# Patient Record
Sex: Male | Born: 1970 | Race: Black or African American | Hispanic: No | Marital: Single | Smoking: Never smoker
Health system: Southern US, Community
[De-identification: ages and names within clinical notes are randomized; demographics above are authoritative.]

## PROBLEM LIST (undated history)

## (undated) DIAGNOSIS — F329 Major depressive disorder, single episode, unspecified: Secondary | ICD-10-CM

## (undated) DIAGNOSIS — W3400XA Accidental discharge from unspecified firearms or gun, initial encounter: Secondary | ICD-10-CM

## (undated) DIAGNOSIS — F32A Depression, unspecified: Secondary | ICD-10-CM

## (undated) DIAGNOSIS — F4310 Post-traumatic stress disorder, unspecified: Secondary | ICD-10-CM

## (undated) DIAGNOSIS — B1920 Unspecified viral hepatitis C without hepatic coma: Secondary | ICD-10-CM

## (undated) DIAGNOSIS — F319 Bipolar disorder, unspecified: Secondary | ICD-10-CM

---

## 1999-04-12 ENCOUNTER — Inpatient Hospital Stay (HOSPITAL_COMMUNITY): Admission: EM | Admit: 1999-04-12 | Discharge: 1999-04-16 | Payer: Self-pay | Admitting: General Practice

## 2001-09-12 ENCOUNTER — Emergency Department (HOSPITAL_COMMUNITY): Admission: EM | Admit: 2001-09-12 | Discharge: 2001-09-12 | Payer: Self-pay | Admitting: *Deleted

## 2002-03-04 ENCOUNTER — Emergency Department (HOSPITAL_COMMUNITY): Admission: EM | Admit: 2002-03-04 | Discharge: 2002-03-04 | Payer: Self-pay | Admitting: Emergency Medicine

## 2004-02-07 ENCOUNTER — Emergency Department (HOSPITAL_COMMUNITY): Admission: EM | Admit: 2004-02-07 | Discharge: 2004-02-07 | Payer: Self-pay | Admitting: Emergency Medicine

## 2004-06-15 ENCOUNTER — Emergency Department (HOSPITAL_COMMUNITY): Admission: EM | Admit: 2004-06-15 | Discharge: 2004-06-15 | Payer: Self-pay | Admitting: Emergency Medicine

## 2004-10-06 ENCOUNTER — Emergency Department (HOSPITAL_COMMUNITY): Admission: EM | Admit: 2004-10-06 | Discharge: 2004-10-06 | Payer: Self-pay | Admitting: Emergency Medicine

## 2006-08-01 ENCOUNTER — Emergency Department (HOSPITAL_COMMUNITY): Admission: EM | Admit: 2006-08-01 | Discharge: 2006-08-01 | Payer: Self-pay | Admitting: Emergency Medicine

## 2007-06-01 ENCOUNTER — Emergency Department (HOSPITAL_COMMUNITY): Admission: EM | Admit: 2007-06-01 | Discharge: 2007-06-01 | Payer: Self-pay | Admitting: Emergency Medicine

## 2007-12-14 ENCOUNTER — Emergency Department (HOSPITAL_COMMUNITY): Admission: EM | Admit: 2007-12-14 | Discharge: 2007-12-14 | Payer: Self-pay | Admitting: Emergency Medicine

## 2008-01-08 ENCOUNTER — Emergency Department: Payer: Self-pay | Admitting: Emergency Medicine

## 2011-08-27 LAB — BASIC METABOLIC PANEL
BUN: 19
CO2: 29
Calcium: 9.3
Chloride: 105
Creatinine, Ser: 1.04
GFR calc Af Amer: >60
GFR calc non Af Amer: >60
Glucose, Bld: 109 — ABNORMAL HIGH
Potassium: 3.7
Sodium: 139

## 2011-08-27 LAB — CBC
HCT: 38.9 — ABNORMAL LOW
Hemoglobin: 13.2
MCHC: 34
MCV: 88.7
Platelets: 226
RBC: 4.39
RDW: 12.5
WBC: 3.9 — ABNORMAL LOW

## 2011-08-27 LAB — DIFFERENTIAL
Basophils Absolute: 0
Basophils Relative: 0
Eosinophils Absolute: 0.1
Eosinophils Relative: 3
Lymphocytes Relative: 38
Lymphs Abs: 1.5
Monocytes Absolute: 0.5
Monocytes Relative: 13 — ABNORMAL HIGH
Neutro Abs: 1.8
Neutrophils Relative %: 46

## 2011-08-27 LAB — RAPID URINE DRUG SCREEN, HOSP PERFORMED
Amphetamines: NOT DETECTED
Barbiturates: NOT DETECTED
Benzodiazepines: NOT DETECTED
Cocaine: POSITIVE — AB
Opiates: NOT DETECTED
Tetrahydrocannabinol: NOT DETECTED

## 2011-08-27 LAB — ETHANOL: Alcohol, Ethyl (B): <5

## 2014-02-08 ENCOUNTER — Emergency Department (HOSPITAL_COMMUNITY): Payer: Self-pay

## 2014-02-08 ENCOUNTER — Observation Stay (HOSPITAL_COMMUNITY)
Admission: EM | Admit: 2014-02-08 | Discharge: 2014-02-09 | Disposition: A | Discharge status: Home / Self Care | Payer: Self-pay | Attending: Internal Medicine | Admitting: Internal Medicine

## 2014-02-08 ENCOUNTER — Encounter (HOSPITAL_COMMUNITY): Payer: Self-pay | Admitting: Emergency Medicine

## 2014-02-08 DIAGNOSIS — R339 Retention of urine, unspecified: Secondary | ICD-10-CM | POA: Insufficient documentation

## 2014-02-08 DIAGNOSIS — F329 Major depressive disorder, single episode, unspecified: Secondary | ICD-10-CM

## 2014-02-08 DIAGNOSIS — R109 Unspecified abdominal pain: Secondary | ICD-10-CM

## 2014-02-08 DIAGNOSIS — F1410 Cocaine abuse, uncomplicated: Secondary | ICD-10-CM | POA: Insufficient documentation

## 2014-02-08 DIAGNOSIS — Z8249 Family history of ischemic heart disease and other diseases of the circulatory system: Secondary | ICD-10-CM | POA: Insufficient documentation

## 2014-02-08 DIAGNOSIS — F3289 Other specified depressive episodes: Secondary | ICD-10-CM | POA: Insufficient documentation

## 2014-02-08 DIAGNOSIS — B1920 Unspecified viral hepatitis C without hepatic coma: Secondary | ICD-10-CM | POA: Insufficient documentation

## 2014-02-08 DIAGNOSIS — R079 Chest pain, unspecified: Principal | ICD-10-CM | POA: Diagnosis present

## 2014-02-08 DIAGNOSIS — R1011 Right upper quadrant pain: Secondary | ICD-10-CM | POA: Insufficient documentation

## 2014-02-08 HISTORY — DX: Major depressive disorder, single episode, unspecified: F32.9

## 2014-02-08 HISTORY — DX: Unspecified viral hepatitis C without hepatic coma: B19.20

## 2014-02-08 HISTORY — DX: Accidental discharge from unspecified firearms or gun, initial encounter: W34.00XA

## 2014-02-08 HISTORY — DX: Depression, unspecified: F32.A

## 2014-02-08 LAB — URINALYSIS, ROUTINE W REFLEX MICROSCOPIC
Glucose, UA: NEGATIVE mg/dL
Hgb urine dipstick: NEGATIVE
Ketones, ur: >80 mg/dL — AB
Leukocytes, UA: NEGATIVE
Nitrite: NEGATIVE
PH: 6 (ref 5.0–8.0)
Protein, ur: 30 mg/dL — AB
SPECIFIC GRAVITY, URINE: 1.033 — AB (ref 1.005–1.030)
Urobilinogen, UA: 2 mg/dL — ABNORMAL HIGH (ref 0.0–1.0)

## 2014-02-08 LAB — CBC
HCT: 33.3 % — ABNORMAL LOW (ref 39.0–52.0)
HCT: 37.1 % — ABNORMAL LOW (ref 39.0–52.0)
Hemoglobin: 11.4 g/dL — ABNORMAL LOW (ref 13.0–17.0)
Hemoglobin: 12.6 g/dL — ABNORMAL LOW (ref 13.0–17.0)
MCH: 30.3 pg (ref 26.0–34.0)
MCH: 30.1 pg (ref 26.0–34.0)
MCHC: 34.2 g/dL (ref 30.0–36.0)
MCHC: 34 g/dL (ref 30.0–36.0)
MCV: 88.6 fL (ref 78.0–100.0)
MCV: 88.5 fL (ref 78.0–100.0)
PLATELETS: 188 10*3/uL (ref 150–400)
Platelets: 176 10*3/uL (ref 150–400)
RBC: 3.76 MIL/uL — ABNORMAL LOW (ref 4.22–5.81)
RBC: 4.19 MIL/uL — AB (ref 4.22–5.81)
RDW: 12.4 % (ref 11.5–15.5)
RDW: 12.4 % (ref 11.5–15.5)
WBC: 6.4 10*3/uL (ref 4.0–10.5)
WBC: 7 10*3/uL (ref 4.0–10.5)

## 2014-02-08 LAB — TROPONIN I
Troponin I: <0.3 ng/mL (ref <0.30)
Troponin I: <0.3 ng/mL (ref <0.30)
Troponin I: <0.3 ng/mL (ref <0.30)

## 2014-02-08 LAB — BASIC METABOLIC PANEL
BUN: 19 mg/dL (ref 6–23)
CO2: 27 mEq/L (ref 19–32)
Calcium: 9.2 mg/dL (ref 8.4–10.5)
Chloride: 102 mEq/L (ref 96–112)
Creatinine, Ser: 1.23 mg/dL (ref 0.50–1.35)
GFR calc Af Amer: 82 mL/min — ABNORMAL LOW (ref >90)
GFR calc non Af Amer: 70 mL/min — ABNORMAL LOW (ref >90)
Glucose, Bld: 78 mg/dL (ref 70–99)
Potassium: 4.1 mEq/L (ref 3.7–5.3)
Sodium: 143 mEq/L (ref 137–147)

## 2014-02-08 LAB — URINE MICROSCOPIC-ADD ON

## 2014-02-08 LAB — CREATININE, SERUM
CREATININE: 1.14 mg/dL (ref 0.50–1.35)
GFR calc Af Amer: 90 mL/min — ABNORMAL LOW (ref >90)
GFR calc non Af Amer: 77 mL/min — ABNORMAL LOW (ref >90)

## 2014-02-08 LAB — RAPID URINE DRUG SCREEN, HOSP PERFORMED
Amphetamines: NOT DETECTED
Barbiturates: NOT DETECTED
Benzodiazepines: NOT DETECTED
COCAINE: POSITIVE — AB
Opiates: NOT DETECTED
TETRAHYDROCANNABINOL: NOT DETECTED

## 2014-02-08 LAB — MRSA PCR SCREENING: MRSA by PCR: NEGATIVE

## 2014-02-08 LAB — PRO B NATRIURETIC PEPTIDE: Pro B Natriuretic peptide (BNP): 199.4 pg/mL — ABNORMAL HIGH (ref 0–125)

## 2014-02-08 LAB — I-STAT TROPONIN, ED: Troponin i, poc: 0 ng/mL (ref 0.00–0.08)

## 2014-02-08 LAB — RPR: RPR: REACTIVE — AB

## 2014-02-08 LAB — RPR TITER: RPR Titer: 1:2 {titer} — AB

## 2014-02-08 LAB — HIV ANTIBODY (ROUTINE TESTING W REFLEX): HIV: NONREACTIVE

## 2014-02-08 MED ORDER — FLUOXETINE HCL 20 MG PO CAPS
20.0000 mg | ORAL_CAPSULE | Freq: Every day | ORAL | Status: DC
  Administered 2014-02-08 – 2014-02-09 (×2): 20 mg via ORAL
  Filled 2014-02-08 (×2): qty 1

## 2014-02-08 MED ORDER — ONDANSETRON HCL 4 MG/2ML IJ SOLN
4.0000 mg | Freq: Once | INTRAMUSCULAR | Status: AC
  Administered 2014-02-08: 4 mg via INTRAVENOUS
  Filled 2014-02-08: qty 2

## 2014-02-08 MED ORDER — RISPERIDONE 1 MG PO TABS
1.0000 mg | ORAL_TABLET | Freq: Every day | ORAL | Status: DC
  Administered 2014-02-08: 1 mg via ORAL
  Filled 2014-02-08 (×2): qty 1

## 2014-02-08 MED ORDER — PANTOPRAZOLE SODIUM 40 MG PO TBEC
40.0000 mg | DELAYED_RELEASE_TABLET | Freq: Every day | ORAL | Status: DC
  Administered 2014-02-08 – 2014-02-09 (×2): 40 mg via ORAL
  Filled 2014-02-08 (×2): qty 1

## 2014-02-08 MED ORDER — GI COCKTAIL ~~LOC~~
30.0000 mL | Freq: Four times a day (QID) | ORAL | Status: DC | PRN
  Administered 2014-02-08: 30 mL via ORAL
  Filled 2014-02-08: qty 30

## 2014-02-08 MED ORDER — MORPHINE SULFATE 4 MG/ML IJ SOLN
4.0000 mg | Freq: Once | INTRAMUSCULAR | Status: AC
  Administered 2014-02-08: 4 mg via INTRAVENOUS
  Filled 2014-02-08: qty 1

## 2014-02-08 MED ORDER — MORPHINE SULFATE 2 MG/ML IJ SOLN
2.0000 mg | INTRAMUSCULAR | Status: DC | PRN
  Administered 2014-02-08: 2 mg via INTRAVENOUS
  Filled 2014-02-08: qty 1

## 2014-02-08 MED ORDER — TAMSULOSIN HCL 0.4 MG PO CAPS
0.4000 mg | ORAL_CAPSULE | Freq: Every day | ORAL | Status: DC
  Administered 2014-02-09: 0.4 mg via ORAL
  Filled 2014-02-08 (×2): qty 1

## 2014-02-08 MED ORDER — HEPARIN SODIUM (PORCINE) 5000 UNIT/ML IJ SOLN
5000.0000 [IU] | Freq: Three times a day (TID) | INTRAMUSCULAR | Status: DC
  Administered 2014-02-08 – 2014-02-09 (×2): 5000 [IU] via SUBCUTANEOUS
  Filled 2014-02-08 (×6): qty 1

## 2014-02-08 MED ORDER — DIVALPROEX SODIUM ER 500 MG PO TB24
1000.0000 mg | ORAL_TABLET | Freq: Every day | ORAL | Status: DC
  Administered 2014-02-08: 1000 mg via ORAL
  Filled 2014-02-08 (×2): qty 2
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
## 2014-02-08 NOTE — H&P (Signed)
Internal Medicine Attending Admission Note Date: 02/08/2014  Patient name: Charles Douglas Medical record number: [id number] Date of birth: [birth year] Age: 43 y.o. Gender: male  I saw and evaluated the patient. I reviewed the resident's note and I agree with the resident's findings and plan as documented in the resident's note, with the following additional comments.  Chief Complaint(s): Chest pain, dizziness, nausea/vomiting  History - key components related to admission: Patient is a 43 year old man with history of hepatitis C, depression, and cocaine abuse admitted with complaint of chest pain, dizziness, and episodes of nausea/vomiting.  Patient reports that he relapsed yesterday and smoked crack cocaine; last night he became dizzy and had a few episodes of nausea/vomiting.  Today he had the onset of chest pain while walking, still with associated dizziness and nausea.  He denies any history of known cardiac disease; he does report that years ago he was treated for high blood pressure and high cholesterol.  He reports a family history of early coronary disease in his father.  He reports that he has been through inpatient drug rehabilitation once in the past, and is currently enrolled in an outpatient program.  Physical Exam - key components related to admission:  Filed Vitals:   02/08/14 1215 02/08/14 1230 02/08/14 1245 02/08/14 1300  BP: 125/73 140/67 131/69   Pulse: 71 87 74   Temp:      TempSrc:      Resp: 19 16 22    Height:    5\' 7"  (1.702 m)  Weight:    141 lb 1.5 oz (64 kg)  SpO2: 99% 99% 98%    General: Alert, no distress Lungs: Clear Heart: Regular; S1-S2, no S3, no S4, no murmurs Abdomen: Bowel sounds present, soft, nontender Shoney's: No edema; no calf tenderness  Lab results:   Basic Metabolic Panel:  Recent Labs  13/24/4002/03/22 0952  NA 143  K 4.1  CL 102  CO2 27  GLUCOSE 78  BUN 19  CREATININE 1.23  CALCIUM 9.2     CBC:  Recent Labs  02/08/14 0952   WBC 6.4  HGB 11.4*  HCT 33.3*  MCV 88.6  PLT 176    BNP:  Recent Labs  02/08/14 0952  PROBNP 199.4*    Troponin i, poc  Date Value Ref Range Status  02/08/2014 0.00  0.00 - 0.08 ng/mL Final     Urine Drug Screen: Drugs of Abuse     Component Value Date/Time   LABOPIA NONE DETECTED 02/08/2014 1258   COCAINSCRNUR POSITIVE* 02/08/2014 1258   LABBENZ NONE DETECTED 02/08/2014 1258   AMPHETMU NONE DETECTED 02/08/2014 1258   THCU NONE DETECTED 02/08/2014 1258   LABBARB NONE DETECTED 02/08/2014 1258     Alcohol Level: No results found for this basename: ETH,  in the last 72 hours  Urinalysis    Component Value Date/Time   COLORURINE AMBER* 02/08/2014 1258   APPEARANCEUR CLEAR 02/08/2014 1258   LABSPEC 1.033* 02/08/2014 1258   PHURINE 6.0 02/08/2014 1258   GLUCOSEU NEGATIVE 02/08/2014 1258   HGBUR NEGATIVE 02/08/2014 1258   BILIRUBINUR SMALL* 02/08/2014 1258   KETONESUR >80* 02/08/2014 1258   PROTEINUR 30* 02/08/2014 1258   UROBILINOGEN 2.0* 02/08/2014 1258   NITRITE NEGATIVE 02/08/2014 1258   LEUKOCYTESUR NEGATIVE 02/08/2014 1258    Urine microscopic:  Recent Labs  02/08/14 1258  EPIU FEW*  WBCU 0-2  BACTERIA RARE  LABCAST HYALINE CASTS*  OTHERU MUCOUS PRESENT      Imaging results:  Dg Chest  2 View  02/08/2014   CLINICAL DATA:  Chest pain  EXAM: CHEST  2 VIEW  COMPARISON:  October 06, 2004  FINDINGS: Lungs are clear. Heart size and pulmonary vascularity are normal. No adenopathy. No pneumothorax. No bone lesions.  IMPRESSION: No abnormality noted.   Electronically Signed   By: Bretta Bang M.D.   On: 02/08/2014 10:23    Other results: EKG: Normal sinus rhythm; minimal voltage criteria for LVH, may be normal variant  Assessment & Plan by Problem:  1.  Chest pain.  Patient's chest pain and associated symptoms were likely provoked by crack cocaine use.  He has no evidence of acute MI by EKG or by the initial cardiac enzyme result.  The plan is monitor; rule out MI with serial enzymes  and EKGs; morphine as needed for pain relief; if pain persists, consider cardiology consult for noninvasive workup to rule out underlying coronary artery disease.  Abstinence from cocaine is a priority, and we have explained this to the patient.  2.  Cocaine abuse.  Patient reports that he has undergone inpatient treatment in the past, and that he is currently enrolled in an outpatient treatment program.  The plan is encouraged compliance with his treatment program and abstinence from cocaine.  3.  Nausea/vomiting.  As above, this is likely related to cocaine use.  Plan is supportive care; PPI; consider further workup if symptoms persist.  4.  Other problems and plans as per the resident physician's note.  Active Problems:   Chest pain

## 2014-02-08 NOTE — ED Notes (Signed)
Pt return back from x ray

## 2014-02-08 NOTE — ED Provider Notes (Signed)
Medical screening examination/treatment/procedure(s) were conducted as a shared visit with non-physician practitioner(s) and myself.  I personally evaluated the patient during the encounter.   EKG Interpretation None      Date: 02/08/2014  Rate: 72  Rhythm: normal sinus rhythm  QRS Axis: normal  Intervals: normal  ST/T Wave abnormalities: normal  Conduction Disutrbances:none  Narrative Interpretation:   Old EKG Reviewed: none available  Patient here after having exertional chest pain made better with nitroglycerin. Does admit to cocaine use but denied having chest pain 2 hours after he uses cocaine. Has additional family history of CAD.     Toy BakerAnthony T Milina Pagett, MD 02/12/14 [phone]

## 2014-02-08 NOTE — ED Notes (Signed)
Pt called [facility] while walking from [place] after having chest pain.  Pt reports doing about $40-50 worth of cocaine last night and nausea and vomiting this am prior to the chest pain starting.  Pt given 324 mg apsirin and 0.4 mg nitroglycerin x2 pain decreased from 6/10 to 2/10.  Pt in NAD, A&O.
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
## 2014-02-09 LAB — COMPREHENSIVE METABOLIC PANEL
ALT: 24 U/L (ref 0–53)
AST: 30 U/L (ref 0–37)
Albumin: 3.3 g/dL — ABNORMAL LOW (ref 3.5–5.2)
Alkaline Phosphatase: 51 U/L (ref 39–117)
BILIRUBIN TOTAL: 0.4 mg/dL (ref 0.3–1.2)
BUN: 15 mg/dL (ref 6–23)
CHLORIDE: 101 meq/L (ref 96–112)
CO2: 28 mEq/L (ref 19–32)
CREATININE: 1.14 mg/dL (ref 0.50–1.35)
Calcium: 8.9 mg/dL (ref 8.4–10.5)
GFR, EST AFRICAN AMERICAN: 90 mL/min — AB (ref >90)
GFR, EST NON AFRICAN AMERICAN: 77 mL/min — AB (ref >90)
GLUCOSE: 99 mg/dL (ref 70–99)
Potassium: 4 mEq/L (ref 3.7–5.3)
Sodium: 141 mEq/L (ref 137–147)
Total Protein: 6.6 g/dL (ref 6.0–8.3)

## 2014-02-09 LAB — HEMOGLOBIN A1C
HEMOGLOBIN A1C: 4.9 % (ref <5.7)
Mean Plasma Glucose: 94 mg/dL (ref <117)

## 2014-02-09 LAB — TROPONIN I: Troponin I: <0.3 ng/mL (ref <0.30)

## 2014-02-09 LAB — GC/CHLAMYDIA PROBE AMP
CT Probe RNA: NEGATIVE
GC PROBE AMP APTIMA: NEGATIVE

## 2014-02-09 LAB — TSH: TSH: 1.487 u[IU]/mL (ref 0.350–4.500)

## 2014-02-09 LAB — T.PALLIDUM AB, IGG: T pallidum Antibodies (TP-PA): >8 S/CO — ABNORMAL HIGH (ref <0.90)

## 2014-02-09 MED ORDER — TAMSULOSIN HCL 0.4 MG PO CAPS: 0.4000 mg | ORAL_CAPSULE | Freq: Every day | ORAL | Status: DC

## 2014-02-09 NOTE — Progress Notes (Signed)
Discharged  home  by wheelchair, stable, belongings with pt.

## 2014-02-09 NOTE — H&P (Signed)
Resident Addendum to Medical Student Note   I have seen and examined the patient, and agree with the the medical student assessment and plan outlined above. Please see my note for additional details.  Charles Douglas, Charles Douglas  02/09/2014, 6:59 AM Pgr: [phone]

## 2014-02-09 NOTE — Discharge Instructions (Signed)
[facility] for evaluation of chest pain that occurred after using cocaine.  Cocaine use puts you at high risk of a serious cause of chest pain, including heart attack.  However, your tests and studies were normal, making it more likely that you chest pain is due to a muscle strain.  This can be treated with rest, heat or ice, and with ibuprofen (over the counter) if needed - no more than 400mg  every 8 hours.  It is very important to stop using cocaine to help prevent serious consequences such as heart attack, stroke, or kidney failure.  Please return to the Emergency Room if you have worsening chest pain, shortness of breath, sudden weakness or changes in vision, or other new symptoms.  Chest Pain (Nonspecific) It is often hard to give a specific diagnosis for the cause of chest pain. There is always a chance that your pain could be related to something serious, such as a heart attack or a blood clot in the lungs. You need to follow up with your caregiver for further evaluation. CAUSES   Heartburn.  Pneumonia or bronchitis.  Anxiety or stress.  Inflammation around your heart (pericarditis) or lung (pleuritis or pleurisy).  A blood clot in the lung.  A collapsed lung (pneumothorax). It can develop suddenly on its own (spontaneous pneumothorax) or from injury (trauma) to the chest.  Shingles infection (herpes zoster virus). The chest wall is composed of bones, muscles, and cartilage. Any of these can be the source of the pain.  The bones can be bruised by injury.  The muscles or cartilage can be strained by coughing or overwork.  The cartilage can be affected by inflammation and become sore (costochondritis). DIAGNOSIS  Lab tests or other studies, such as X-rays, electrocardiography, stress testing, or cardiac imaging, may be needed to find the cause of your pain.  TREATMENT   Treatment depends on what may be causing your chest pain. Treatment may  include:  Acid blockers for heartburn.  Anti-inflammatory medicine.  Pain medicine for inflammatory conditions.  Antibiotics if an infection is present.  You may be advised to change lifestyle habits. This includes stopping smoking and avoiding alcohol, caffeine, and chocolate.  You may be advised to keep your head raised (elevated) when sleeping. This reduces the chance of acid going backward from your stomach into your esophagus.  Most of the time, nonspecific chest pain will improve within 2 to 3 days with rest and mild pain medicine. HOME CARE INSTRUCTIONS   If antibiotics were prescribed, take your antibiotics as directed. Finish them even if you start to feel better.  For the next few days, avoid physical activities that bring on chest pain. Continue physical activities as directed.  Do not smoke.  Avoid drinking alcohol.  Only take over-the-counter or prescription medicine for pain, discomfort, or fever as directed by your caregiver.  Follow your caregiver's suggestions for further testing if your chest pain does not go away.  Keep any follow-up appointments you made. If you do not go to an appointment, you could develop lasting (chronic) problems with pain. If there is any problem keeping an appointment, you must call to reschedule. SEEK MEDICAL CARE IF:   You think you are having problems from the medicine you are taking. Read your medicine instructions carefully.  Your chest pain does not go away, even after treatment.  You develop a rash with blisters on your chest. SEEK IMMEDIATE MEDICAL CARE IF:   You have  increased chest pain or pain that spreads to your arm, neck, jaw, back, or abdomen.  You develop shortness of breath, an increasing cough, or you are coughing up blood.  You have severe back or abdominal pain, feel nauseous, or vomit.  You develop severe weakness, fainting, or chills.  You have a fever. THIS IS AN EMERGENCY. Do not wait to see if the  pain will go away. Get medical help at once. Call your local emergency services (911 in [place] Do not drive yourself to the hospital. MAKE SURE YOU:   Understand these instructions.  Will watch your condition.  Will get help right away if you are not doing well or get worse. Document Released: 09/03/2005 Document Revised: 02/16/2012 Document Reviewed: 06/29/2008 [facility] Patient Information 2014 [place], [place]. Chest Wall Pain Chest wall pain is pain in or around the bones and muscles of your chest. It may take up to 6 weeks to get better. It may take longer if you must stay physically active in your work and activities.  CAUSES  Chest wall pain may happen on its own. However, it may be caused by:  A viral illness like the flu.  Injury.  Coughing.  Exercise.  Arthritis.  Fibromyalgia.  Shingles. HOME CARE INSTRUCTIONS   Avoid overtiring physical activity. Try not to strain or perform activities that cause pain. This includes any activities using your chest or your abdominal and side muscles, especially if heavy weights are used.  Put ice on the sore area.  Put ice in a plastic bag.  Place a towel between your skin and the bag.  Leave the ice on for 15-20 minutes per hour while awake for the first 2 days.  Only take over-the-counter or prescription medicines for pain, discomfort, or fever as directed by your caregiver. SEEK IMMEDIATE MEDICAL CARE IF:   Your pain increases, or you are very uncomfortable.  You have a fever.  Your chest pain becomes worse.  You have new, unexplained symptoms.  You have nausea or vomiting.  You feel sweaty or lightheaded.  You have a cough with phlegm (sputum), or you cough up blood. MAKE SURE YOU:   Understand these instructions.  Will watch your condition.  Will get help right away if you are not doing well or get worse. Document Released: 11/24/2005 Document Revised: 02/16/2012 Document Reviewed: 07/21/2011 [facility]  Patient Information 2014 [place], [place].

## 2014-02-09 NOTE — Progress Notes (Signed)
Subjective: Nursing staff reports some behavioral issues with patient exposing himself to male staff.  Patient reports continued mild right sided chest pain, 2-3/10, and continued mild abdominal pain, which has been chronic for him.  He reports that his urinary voiding improved on tamsulosin.  Objective: Vital signs in last 24 hours: Filed Vitals:   02/08/14 2100 02/08/14 2353 02/09/14 0459 02/09/14 0746  BP: 129/74 111/56 104/56 102/63  Pulse: 70 59 51 52  Temp:  97.7 F (36.5 C) 97.2 F (36.2 C) 97.7 F (36.5 C)  TempSrc:  Oral Oral Oral  Resp:      Height:      Weight:      SpO2: 97% 97% 96% 98%   Weight change:   Intake/Output Summary (Last 24 hours) at 02/09/14 0849 Last data filed at 02/09/14 0300  Gross per 24 hour  Intake    480 ml  Output   1525 ml  Net  -1045 ml   Physical Exam General: Resting comfortably in no acute distress.  Eyes: EOMI. Conjunctivae normal, sclerae clear and anicteric.  HENT: Oropharyngeal mucous membranes pink and moist.  Pulm: Normal respiratory rate and effort. CTAB. Cardio: RRR, S1 and S2 with no murmur, rub, or gallop appreciated.  Abdomen: Normoactive bowel sounds. Soft, non-distended. + mild tenderness to palpation especially over the right mid-upper quadrant.  Extremities: Warm and well perfused, with DP pulses 2+ bilaterally. No edema or cyanosis. +Multiple circular superficial lesions over lower extremities in various stages of healing.  Neuro: Alert, oriented, and appropriate. CN II-XII grossly intact.   Lab Results:  Results for orders placed during the hospital encounter of 02/08/14 (from the past 24 hour(s))  CBC     Status: Abnormal   Collection Time    02/08/14  9:52 AM      Result Value Ref Range   WBC 6.4  4.0 - 10.5 K/uL   RBC 3.76 (*) 4.22 - 5.81 MIL/uL   Hemoglobin 11.4 (*) 13.0 - 17.0 g/dL   HCT 91.4 (*) 78.2 - 95.6 %   MCV 88.6  78.0 - 100.0 fL   MCH 30.3  26.0 - 34.0 pg   MCHC 34.2  30.0 - 36.0 g/dL   RDW  21.3  08.6 - 57.8 %   Platelets 176  150 - 400 K/uL  BASIC METABOLIC PANEL     Status: Abnormal   Collection Time    02/08/14  9:52 AM      Result Value Ref Range   Sodium 143  137 - 147 mEq/L   Potassium 4.1  3.7 - 5.3 mEq/L   Chloride 102  96 - 112 mEq/L   CO2 27  19 - 32 mEq/L   Glucose, Bld 78  70 - 99 mg/dL   BUN 19  6 - 23 mg/dL   Creatinine, Ser 4.69  0.50 - 1.35 mg/dL   Calcium 9.2  8.4 - 62.9 mg/dL   GFR calc non Af Amer 70 (*) >90 mL/min   GFR calc Af Amer 82 (*) >90 mL/min  PRO B NATRIURETIC PEPTIDE     Status: Abnormal   Collection Time    02/08/14  9:52 AM      Result Value Ref Range   Pro B Natriuretic peptide (BNP) 199.4 (*) 0 - 125 pg/mL  I-STAT TROPOININ, ED     Status: None   Collection Time    02/08/14 10:27 AM      Result Value Ref Range   Troponin i,  poc 0.00  0.00 - 0.08 ng/mL   Comment 3           URINE RAPID DRUG SCREEN (HOSP PERFORMED)     Status: Abnormal   Collection Time    02/08/14 12:58 PM      Result Value Ref Range   Opiates NONE DETECTED  NONE DETECTED   Cocaine POSITIVE (*) NONE DETECTED   Benzodiazepines NONE DETECTED  NONE DETECTED   Amphetamines NONE DETECTED  NONE DETECTED   Tetrahydrocannabinol NONE DETECTED  NONE DETECTED   Barbiturates NONE DETECTED  NONE DETECTED  URINALYSIS, ROUTINE W REFLEX MICROSCOPIC     Status: Abnormal   Collection Time    02/08/14 12:58 PM      Result Value Ref Range   Color, Urine AMBER (*) YELLOW   APPearance CLEAR  CLEAR   Specific Gravity, Urine 1.033 (*) 1.005 - 1.030   pH 6.0  5.0 - 8.0   Glucose, UA NEGATIVE  NEGATIVE mg/dL   Hgb urine dipstick NEGATIVE  NEGATIVE   Bilirubin Urine SMALL (*) NEGATIVE   Ketones, ur >80 (*) NEGATIVE mg/dL   Protein, ur 30 (*) NEGATIVE mg/dL   Urobilinogen, UA 2.0 (*) 0.0 - 1.0 mg/dL   Nitrite NEGATIVE  NEGATIVE   Leukocytes, UA NEGATIVE  NEGATIVE  URINE MICROSCOPIC-ADD ON     Status: Abnormal   Collection Time    02/08/14 12:58 PM      Result Value Ref Range     Squamous Epithelial / LPF FEW (*) RARE   WBC, UA 0-2  <3 WBC/hpf   Bacteria, UA RARE  RARE   Casts HYALINE CASTS (*) NEGATIVE   Urine-Other MUCOUS PRESENT    MRSA PCR SCREENING     Status: None   Collection Time    02/08/14  1:12 PM      Result Value Ref Range   MRSA by PCR NEGATIVE  NEGATIVE  HIV ANTIBODY (ROUTINE TESTING)     Status: None   Collection Time    02/08/14  2:13 PM      Result Value Ref Range   HIV NON REACTIVE  NON REACTIVE  RPR     Status: Abnormal   Collection Time    02/08/14  2:13 PM      Result Value Ref Range   RPR Reactive (*) NON REACTIVE  TROPONIN I     Status: None   Collection Time    02/08/14  2:13 PM      Result Value Ref Range   Troponin I <0.30  <0.30 ng/mL  RPR TITER     Status: Abnormal   Collection Time    02/08/14  2:13 PM      Result Value Ref Range   RPR Titer 1:2 (*) NON REACTIVE  CBC     Status: Abnormal   Collection Time    02/08/14  3:50 PM      Result Value Ref Range   WBC 7.0  4.0 - 10.5 K/uL   RBC 4.19 (*) 4.22 - 5.81 MIL/uL   Hemoglobin 12.6 (*) 13.0 - 17.0 g/dL   HCT 16.1 (*) 09.6 - 04.5 %   MCV 88.5  78.0 - 100.0 fL   MCH 30.1  26.0 - 34.0 pg   MCHC 34.0  30.0 - 36.0 g/dL   RDW 40.9  81.1 - 91.4 %   Platelets 188  150 - 400 K/uL  CREATININE, SERUM     Status: Abnormal   Collection Time  02/08/14  3:50 PM      Result Value Ref Range   Creatinine, Ser 1.14  0.50 - 1.35 mg/dL   GFR calc non Af Amer 77 (*) >90 mL/min   GFR calc Af Amer 90 (*) >90 mL/min  TROPONIN I     Status: None   Collection Time    02/08/14  3:51 PM      Result Value Ref Range   Troponin I <0.30  <0.30 ng/mL  TROPONIN I     Status: None   Collection Time    02/08/14  6:48 PM      Result Value Ref Range   Troponin I <0.30  <0.30 ng/mL  TROPONIN I     Status: None   Collection Time    02/09/14  1:04 AM      Result Value Ref Range   Troponin I <0.30  <0.30 ng/mL  COMPREHENSIVE METABOLIC PANEL     Status: Abnormal   Collection Time     02/09/14  2:53 AM      Result Value Ref Range   Sodium 141  137 - 147 mEq/L   Potassium 4.0  3.7 - 5.3 mEq/L   Chloride 101  96 - 112 mEq/L   CO2 28  19 - 32 mEq/L   Glucose, Bld 99  70 - 99 mg/dL   BUN 15  6 - 23 mg/dL   Creatinine, Ser 1.611.14  0.50 - 1.35 mg/dL   Calcium 8.9  8.4 - 09.610.5 mg/dL   Total Protein 6.6  6.0 - 8.3 g/dL   Albumin 3.3 (*) 3.5 - 5.2 g/dL   AST 30  0 - 37 U/L   ALT 24  0 - 53 U/L   Alkaline Phosphatase 51  39 - 117 U/L   Total Bilirubin 0.4  0.3 - 1.2 mg/dL   GFR calc non Af Amer 77 (*) >90 mL/min   GFR calc Af Amer 90 (*) >90 mL/min   Studies/Results: Dg Chest 2 View  02/08/2014   CLINICAL DATA:  Chest pain  EXAM: CHEST  2 VIEW  COMPARISON:  October 06, 2004  FINDINGS: Lungs are clear. Heart size and pulmonary vascularity are normal. No adenopathy. No pneumothorax. No bone lesions.  IMPRESSION: No abnormality noted.   Electronically Signed   By: Bretta BangWilliam  Woodruff M.D.   On: 02/08/2014 10:23   <ECG>  ECG this AM with sinus bradycardia, no evidence of ischemia or infarction, unchanged from yesterday.  Medications: I have reviewed the patient's current medications. Scheduled Meds: . divalproex  1,000 mg Oral QHS  . FLUoxetine  20 mg Oral Daily  . heparin  5,000 Units Subcutaneous 3 times per day  . pantoprazole  40 mg Oral Daily  . risperiDONE  1 mg Oral QHS  . tamsulosin  0.4 mg Oral QPC breakfast   Continuous Infusions:  PRN Meds:.gi cocktail, morphine injection Assessment/Plan: Active Problems:   Chest pain  Chest pain  Mr. Charles Douglas is a 43 year old man with a history of severe depression and hepatitis C who presented with chest pain onset 11 hours after crack cocaine use.   # Chest pain: Associated lightheadedness and shortness of breath and the setting of cocaine use were concerning for a possible ischemic origin, and BNP was slightly elevated at 199; however, serial troponins were negative, repeat ECGs were normal, telemetry recorded no acute  events, and TIMI score is 0-1.  Pain is right sided and highly reproducible.  -counseled patient re:  abstinence from cocaine  -patient seen by Child psychotherapist in ED -fasting lipid panel cancelled; follow up on an outpatient basis -A1c pending -TSH pending -discharge today after ruling out ACS  # Abdominal pain: RUQ pain in a patient with known Hepatitis C suggestive of inflammation of the liver capsule; pain is mild, chronic, and unchanged recently.  LFTs normal. -no further inpatient workup required  # Depression: Patient without suicidal ideation, sees a psychiatrist 'Amanda' in [place] but states he cannot remember her last name; patient feels his psychiatric care is going well -continue home medications: divalproex, risperidone, and fluoxetine   # Incomplete voiding: Symptoms consistent with BPH, although patient at risk for STIs given multiple sexual partners and history of syphilis.  Patient reports improvement after starting tamsulosin  -continue tamsulosin -HIV negative -RPR titer 1:2, c/w treated syphilis in 2010 -CG/Chlamydia pending   This is a Psychologist, occupational Note.  The care of the patient was discussed with Dr. Burtis Junes and the assessment and plan formulated with their assistance.  Please see their attached note for official documentation of the daily encounter.   LOS: 1 day   Jorge Mandril, Med Student 02/09/2014, 8:49 AM    I have seen the patient and reviewed the daily progress note by Glendon Axe MS 4 and discussed the care of the patient with them.  See below for documentation of my findings, assessment, and plans.  Subjective: Pt had rude and inappropriate behavior with male staff. No acute events overnight. VSS. Pt not having any pain this AM.  Tele reviewed: NSR.   Objective: Vital signs in last 24 hours: Filed Vitals:   02/08/14 2100 02/08/14 2353 02/09/14 0459 02/09/14 0746  BP: 129/74 111/56 104/56 102/63  Pulse: 70 59 51 52  Temp:  97.7 F  (36.5 C) 97.2 F (36.2 C) 97.7 F (36.5 C)  TempSrc:  Oral Oral Oral  Resp:      Height:      Weight:      SpO2: 97% 97% 96% 98%   Weight change:   Intake/Output Summary (Last 24 hours) at 02/09/14 1127 Last data filed at 02/09/14 0900  Gross per 24 hour  Intake    780 ml  Output   1525 ml  Net   -745 ml   General: resting in bed HEENT: PERRL, EOMI, no scleral icterus Cardiac: RRR, no rubs, murmurs or gallops Pulm: clear to auscultation bilaterally, moving normal volumes of air Abd: soft, nontender, nondistended, BS present Ext: warm and well perfused, no pedal edema Neuro: alert and oriented X3, cranial nerves II-XII grossly intact  Medications: I have reviewed the patient's current medications. Scheduled Meds: . divalproex  1,000 mg Oral QHS  . FLUoxetine  20 mg Oral Daily  . heparin  5,000 Units Subcutaneous 3 times per day  . pantoprazole  40 mg Oral Daily  . risperiDONE  1 mg Oral QHS  . tamsulosin  0.4 mg Oral QPC breakfast   Continuous Infusions:  PRN Meds:.gi cocktail, morphine injection Assessment/Plan: # Chest pain: Pt has atypical presentation, recent cocaine use (repeat UDS +cocaine) but in the setting of extensive family hx of early cardiac disease cardiac etiology must be ruled out. Pt apparently does have some primary care services abnd denied any known hx of HTN or HLD. Pt TIMI is 1. Initial troponin was negative with no new EKG changes. Pt with some generalized GI complaint indicating possible gastritis. Pt is ttp over right pectoralis muscle making MSK possible etiology. Pt  has no hypoxia/tachycardia and Geneva score 0 making PE lower on differential. No signs of pancreatitis, dissection, or esophageal rupture. CXR no signs of PNA or pneumothorax. Trops negx3, repeat EKG w/o ischemic changes.  -rest, NSAIDs, and hot/cold packs for MSK pain -consider oupt stress test at PCP followup -FLD, HgbA1c, TSH pending at discharge  #Urinary retention: Pts hx seems  consistent with BPH. UA negative for active infection. HIV negative. GC/chlamdia negative, RPR typical for previously treated infected.  -monitor I&O  -flomax   #Severe Depression: pt is not actively suicidal or homicidal at this time. Will continue home meds.  -cont Risperdal, Prozac, depakote  Dispo: Disposition is deferred at this time, awaiting improvement of current medical problems.  Anticipated discharge in approximately 1 day(s).   The patient does have a current PCP (No primary provider on file.) and does need an [facility] hospital follow-up appointment after discharge.  The patient does not have transportation limitations that hinder transportation to clinic appointments.  .Services Needed at time of discharge: Y = Yes, Blank = No PT:   OT:   RN:   Equipment:   Other:     LOS: 1 day   Christen Bame, MD 02/09/2014, 11:27 AM Pgr: [phone]

## 2014-02-09 NOTE — Discharge Summary (Signed)
Name: Charles Douglas MRN: [id number] DOB: 04/06/1971 43 y.o. PCP: No primary provider on file.  Date of Admission: 02/08/2014  9:21 AM Date of Discharge: 02/11/2014 Attending Physician: Dr. Meredith Douglas  Discharge Diagnosis: Active Problems:   Chest pain  Discharge Medications:   Medication List         divalproex 500 MG 24 hr tablet  Commonly known as:  DEPAKOTE ER  Take 1,000 mg by mouth at bedtime.     FLUoxetine 20 MG capsule  Commonly known as:  PROZAC  Take 20 mg by mouth daily.     risperiDONE 1 MG tablet  Commonly known as:  RISPERDAL  Take 1 mg by mouth at bedtime.     tamsulosin 0.4 MG Caps capsule  Commonly known as:  FLOMAX  Take 1 capsule (0.4 mg total) by mouth daily after breakfast.     traMADol 50 MG tablet  Commonly known as:  ULTRAM  Take 50 mg by mouth every 8 (eight) hours as needed for moderate pain.     traZODone 100 MG tablet  Commonly known as:  DESYREL  Take 50-100 mg by mouth at bedtime.       Disposition and follow-up:   Charles Douglas [facility] in Stable condition.  At the hospital follow up visit please address:  1.  Possible need for cardiac stress testing  2.  Labs / imaging needed at time of follow-up: none  3.  Pending labs/ test needing follow-up: none  Follow-up Appointments: Follow-up Information   Follow up with [facility] center On 02/20/2014. (Your appointment 3:30 pm)       Discharge Instructions: Discharge Orders   Future Orders Complete By Expires   Diet - low sodium heart healthy  As directed    Increase activity slowly  As directed       Consultations:    Procedures Performed:  Dg Chest 2 View  02/08/2014   CLINICAL DATA:  Chest pain  EXAM: CHEST  2 VIEW  COMPARISON:  October 06, 2004  FINDINGS: Lungs are clear. Heart size and pulmonary vascularity are normal. No adenopathy. No pneumothorax. No bone lesions.  IMPRESSION: No abnormality noted.   Electronically  Signed   By: Charles Douglas M.D.   On: 02/08/2014 10:23   Admission HPI: Charles Douglas is a 43 yo man pmh of severe depression, hepC, and cocaine abuse who presented with ongoing chest pain. The patient states that yesterday he "relapsed" using about $40-$50 worth of crack (smoked). This chest pain was 5/10 in quality sharp in nature centered around his right chest radiating to right shoulder that started suddenly when he was walking from [place] to [place]. This pain was accompanied by a period of vomiting and some nausea. He denied any SOB, DOE, HA, blurry vision, numbness/tingling, fever/chills, diarrhea, constipation, or syncope. He denied any other drug use including EtOH, smoking, or IVDU hx. Before this chest pain incident he was feeling overall well but has had increased anorexia (with estimated 20lb weight loss in 1-2 months) and nausea with some RUQ pain. Over the past month the patient states he has had trouble urinating. He has had very poor stream, dribbles his urine, feels he doesn't fully empty his bladder. He has had several unprotected sexual encounters and a history of treated syphilis. He denied any penile drainage, scrotal/testicular pain, dysuria, pyuria, or hematuria.  His main complaint is "flares" of his depression that caused him to smoke cocaine.  He is not actively suicidal but has had periods of fleeting/passive suicidal thoughts over the past months. He is undergoing treatment and CBT therapy with a counselor. 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   Hospital Course by problem list: # Chest pain: Pt had atypical presentation, recent cocaine use (repeat UDS +cocaine) but in the setting of extensive family hx of early cardiac disease. Pt apparently does have some primary care services and denied any known hx of HTN or HLD. Pt TIMI was 1. Pt was ttp over right pectoralis muscle making MSK  possible etiology. Pt has no hypoxia/tachycardia and Geneva score 0 making PE lower on differential. No signs of pancreatitis, dissection, or esophageal rupture. CXR no signs of PNA or pneumothorax. Serial troponins were negative, ECGs were unchanged and showed no evidence of ischemia or infarct ruling out ACS. Pt may benefit from outpt stress testing. Risk stratification labs were drawn, with A1c 4.9 and TSH wnl. During his hospitalization, nursing staff reported behavioral issues with the male staff, which were resolved by having only male staff care for patient.  #Abdominal pain: pt has hx of HepC with some hepatomegaly on exam. ddx included chronic hepc infection, gastritis, or GERD. Pt had negative murphy's on exam and no lab abnormalities to suggest pancreatitis/gallbladder pathology.consider hepc viral load and [facility].   #Urinary retention: Pts hx seems consistent with BPH. UA negative for active infection. STD panel (includign HIV, GC/chlamydia) negative. Pt had great improvement with start of Flomax. This was continued at discharge.   #Severe Depression: pt was not actively suicidal or homicidal during admission. Therefore pt was continued on home meds of Risperdal, Prozac, and depakote.  Discharge Vitals:   BP 102/63  Pulse 52  Temp(Src) 97.7 F (36.5 C) (Oral)  Resp 17  Ht 5\' 7"  (1.702 m)  Wt 141 lb 1.5 oz (64 kg)  BMI 22.09 kg/m2  SpO2 98%  General: Resting comfortably in no acute distress.  Eyes: EOMI. Conjunctivae normal, sclerae clear and anicteric.  HENT: Oropharyngeal mucous membranes pink and moist.  Pulm: Normal respiratory rate and effort. CTAB.  Cardio: RRR, S1 and S2 with no murmur, rub, or gallop appreciated.  Abdomen: Normoactive bowel sounds. Soft, non-distended. + mild tenderness to palpation especially over the right mid-upper quadrant.  Extremities: Warm and well perfused, with DP pulses 2+ bilaterally. No edema or cyanosis.  +Multiple circular superficial lesions over lower extremities in various stages of healing.  Neuro: Alert, oriented, and appropriate. CN II-XII grossly intact.   Signed: Christen BameNora Brieanne Mignone, MD 02/11/2014, 12:13 PM   Time Spent on Discharge: 35 minutes Services Ordered on Discharge: none Equipment Ordered on Discharge: none

## 2014-02-11 NOTE — ED Provider Notes (Signed)
Medical screening examination/treatment/procedure(s) were conducted as a shared visit with non-physician practitioner(s) and myself.  I personally evaluated the patient during the encounter.   EKG Interpretation None       Toy BakerAnthony T Caressa Scearce, MD 02/11/14 [id number]

## 2015-09-02 ENCOUNTER — Encounter (HOSPITAL_COMMUNITY): Payer: Self-pay | Admitting: Emergency Medicine

## 2015-09-02 ENCOUNTER — Emergency Department (HOSPITAL_COMMUNITY)
Admission: EM | Admit: 2015-09-02 | Discharge: 2015-09-02 | Disposition: A | Discharge status: Other Acute Inpatient Hospital | Payer: Self-pay | Attending: Emergency Medicine | Admitting: Emergency Medicine

## 2015-09-02 ENCOUNTER — Observation Stay (HOSPITAL_COMMUNITY)
Admission: AD | Admit: 2015-09-02 | Discharge: 2015-09-04 | Disposition: A | Discharge status: Home / Self Care | Payer: No Typology Code available for payment source | Source: Intra-hospital | Attending: Psychiatry | Admitting: Psychiatry

## 2015-09-02 ENCOUNTER — Emergency Department (HOSPITAL_COMMUNITY): Payer: Self-pay

## 2015-09-02 ENCOUNTER — Encounter (HOSPITAL_COMMUNITY): Payer: Self-pay | Admitting: *Deleted

## 2015-09-02 DIAGNOSIS — F332 Major depressive disorder, recurrent severe without psychotic features: Principal | ICD-10-CM | POA: Insufficient documentation

## 2015-09-02 DIAGNOSIS — N289 Disorder of kidney and ureter, unspecified: Secondary | ICD-10-CM | POA: Insufficient documentation

## 2015-09-02 DIAGNOSIS — F1494 Cocaine use, unspecified with cocaine-induced mood disorder: Secondary | ICD-10-CM | POA: Insufficient documentation

## 2015-09-02 DIAGNOSIS — R0789 Other chest pain: Secondary | ICD-10-CM | POA: Insufficient documentation

## 2015-09-02 DIAGNOSIS — F1994 Other psychoactive substance use, unspecified with psychoactive substance-induced mood disorder: Secondary | ICD-10-CM | POA: Diagnosis present

## 2015-09-02 DIAGNOSIS — F1910 Other psychoactive substance abuse, uncomplicated: Secondary | ICD-10-CM | POA: Diagnosis present

## 2015-09-02 DIAGNOSIS — R1013 Epigastric pain: Secondary | ICD-10-CM | POA: Insufficient documentation

## 2015-09-02 DIAGNOSIS — F1410 Cocaine abuse, uncomplicated: Secondary | ICD-10-CM | POA: Insufficient documentation

## 2015-09-02 DIAGNOSIS — R45851 Suicidal ideations: Secondary | ICD-10-CM

## 2015-09-02 DIAGNOSIS — Z590 Homelessness: Secondary | ICD-10-CM | POA: Insufficient documentation

## 2015-09-02 DIAGNOSIS — B1920 Unspecified viral hepatitis C without hepatic coma: Secondary | ICD-10-CM | POA: Insufficient documentation

## 2015-09-02 DIAGNOSIS — F329 Major depressive disorder, single episode, unspecified: Secondary | ICD-10-CM | POA: Insufficient documentation

## 2015-09-02 DIAGNOSIS — Z8619 Personal history of other infectious and parasitic diseases: Secondary | ICD-10-CM | POA: Insufficient documentation

## 2015-09-02 DIAGNOSIS — Z79899 Other long term (current) drug therapy: Secondary | ICD-10-CM | POA: Insufficient documentation

## 2015-09-02 LAB — COMPREHENSIVE METABOLIC PANEL
ALT: 39 U/L (ref 17–63)
AST: 42 U/L — ABNORMAL HIGH (ref 15–41)
Albumin: 4.9 g/dL (ref 3.5–5.0)
Alkaline Phosphatase: 64 U/L (ref 38–126)
Anion gap: 12 (ref 5–15)
BILIRUBIN TOTAL: 1.2 mg/dL (ref 0.3–1.2)
BUN: 25 mg/dL — AB (ref 6–20)
CHLORIDE: 103 mmol/L (ref 101–111)
CO2: 26 mmol/L (ref 22–32)
CREATININE: 1.77 mg/dL — AB (ref 0.61–1.24)
Calcium: 9.6 mg/dL (ref 8.9–10.3)
GFR, EST AFRICAN AMERICAN: 52 mL/min — AB (ref >60)
GFR, EST NON AFRICAN AMERICAN: 45 mL/min — AB (ref >60)
Glucose, Bld: 79 mg/dL (ref 65–99)
POTASSIUM: 3.5 mmol/L (ref 3.5–5.1)
Sodium: 141 mmol/L (ref 135–145)
TOTAL PROTEIN: 9.1 g/dL — AB (ref 6.5–8.1)

## 2015-09-02 LAB — SALICYLATE LEVEL

## 2015-09-02 LAB — TROPONIN I

## 2015-09-02 LAB — CBC
HCT: 43.7 % (ref 39.0–52.0)
Hemoglobin: 14.6 g/dL (ref 13.0–17.0)
MCH: 30 pg (ref 26.0–34.0)
MCHC: 33.4 g/dL (ref 30.0–36.0)
MCV: 89.9 fL (ref 78.0–100.0)
PLATELETS: 231 10*3/uL (ref 150–400)
RBC: 4.86 MIL/uL (ref 4.22–5.81)
RDW: 12.4 % (ref 11.5–15.5)
WBC: 9.2 10*3/uL (ref 4.0–10.5)

## 2015-09-02 LAB — ETHANOL

## 2015-09-02 LAB — RAPID URINE DRUG SCREEN, HOSP PERFORMED
Amphetamines: NOT DETECTED
BARBITURATES: NOT DETECTED
Benzodiazepines: NOT DETECTED
COCAINE: POSITIVE — AB
Opiates: NOT DETECTED
TETRAHYDROCANNABINOL: NOT DETECTED

## 2015-09-02 LAB — ACETAMINOPHEN LEVEL: Acetaminophen (Tylenol), Serum: <10 ug/mL — ABNORMAL LOW (ref 10–30)

## 2015-09-02 LAB — LIPASE, BLOOD: Lipase: 23 U/L (ref 22–51)

## 2015-09-02 LAB — I-STAT TROPONIN, ED: Troponin i, poc: 0.01 ng/mL (ref 0.00–0.08)

## 2015-09-02 MED ORDER — IBUPROFEN 400 MG PO TABS: 600.0000 mg | ORAL_TABLET | Freq: Three times a day (TID) | ORAL | Status: DC | PRN

## 2015-09-02 MED ORDER — FLUOXETINE HCL 20 MG PO CAPS
20.0000 mg | ORAL_CAPSULE | Freq: Every day | ORAL | Status: DC
  Administered 2015-09-03 – 2015-09-04 (×2): 20 mg via ORAL
  Filled 2015-09-02 (×2): qty 1

## 2015-09-02 MED ORDER — GABAPENTIN 300 MG PO CAPS
300.0000 mg | ORAL_CAPSULE | Freq: Three times a day (TID) | ORAL | Status: DC
  Administered 2015-09-02 (×2): 300 mg via ORAL
  Filled 2015-09-02 (×2): qty 1

## 2015-09-02 MED ORDER — TRAZODONE HCL 50 MG PO TABS: 50.0000 mg | ORAL_TABLET | Freq: Every evening | ORAL | Status: DC | PRN

## 2015-09-02 MED ORDER — ALUM & MAG HYDROXIDE-SIMETH 200-200-20 MG/5ML PO SUSP: 30.0000 mL | ORAL | Status: DC | PRN

## 2015-09-02 MED ORDER — ONDANSETRON HCL 4 MG PO TABS: 4.0000 mg | ORAL_TABLET | Freq: Three times a day (TID) | ORAL | Status: DC | PRN

## 2015-09-02 MED ORDER — FLUOXETINE HCL 20 MG PO CAPS
20.0000 mg | ORAL_CAPSULE | Freq: Every day | ORAL | Status: DC
  Administered 2015-09-02: 20 mg via ORAL
  Filled 2015-09-02: qty 1

## 2015-09-02 MED ORDER — RISPERIDONE 1 MG PO TABS
1.0000 mg | ORAL_TABLET | Freq: Every day | ORAL | Status: DC
  Administered 2015-09-02 – 2015-09-03 (×2): 1 mg via ORAL
  Filled 2015-09-02 (×2): qty 1

## 2015-09-02 MED ORDER — GABAPENTIN 300 MG PO CAPS
300.0000 mg | ORAL_CAPSULE | Freq: Three times a day (TID) | ORAL | Status: DC
  Administered 2015-09-02 – 2015-09-04 (×6): 300 mg via ORAL
  Filled 2015-09-02 (×6): qty 1

## 2015-09-02 MED ORDER — ZOLPIDEM TARTRATE 5 MG PO TABS: 5.0000 mg | ORAL_TABLET | Freq: Every evening | ORAL | Status: DC | PRN

## 2015-09-02 MED ORDER — RISPERIDONE 0.5 MG PO TABS: 1.0000 mg | ORAL_TABLET | Freq: Every day | ORAL | Status: DC

## 2015-09-02 MED ORDER — ACETAMINOPHEN 325 MG PO TABS: 650.0000 mg | ORAL_TABLET | Freq: Four times a day (QID) | ORAL | Status: DC | PRN

## 2015-09-02 MED ORDER — LORAZEPAM 1 MG PO TABS: 1.0000 mg | ORAL_TABLET | Freq: Three times a day (TID) | ORAL | Status: DC | PRN

## 2015-09-02 MED ORDER — AMITRIPTYLINE HCL 25 MG PO TABS: 50.0000 mg | ORAL_TABLET | Freq: Every day | ORAL | Status: DC

## 2015-09-02 MED ORDER — MAGNESIUM HYDROXIDE 400 MG/5ML PO SUSP: 30.0000 mL | Freq: Every day | ORAL | Status: DC | PRN

## 2015-09-02 MED ORDER — AMITRIPTYLINE HCL 25 MG PO TABS
50.0000 mg | ORAL_TABLET | Freq: Every day | ORAL | Status: DC
  Administered 2015-09-02: 50 mg via ORAL
  Filled 2015-09-02: qty 2

## 2015-09-02 MED ORDER — NICOTINE 21 MG/24HR TD PT24: 21.0000 mg | MEDICATED_PATCH | Freq: Every day | TRANSDERMAL | Status: DC

## 2015-09-02 NOTE — Progress Notes (Signed)
Disposition CSW completed patient referrals to the following inpatient psych facilities:  [facility]  CSW will continue to follow patient for placement needs.  [facility] Disposition CSW [phone]

## 2015-09-02 NOTE — Progress Notes (Signed)
D: Patient sleeping on/off most of the shift. Minimal conversation. Denies pain, SI,AH/VH at this time. Made no new complaint.  A: Offered encouragement and support as needed. Due medications given as ordered every 15 minutes check for safety maintained. Will continue to monitor patient for safety and stability. R: Patient is safe.

## 2015-09-02 NOTE — ED Notes (Signed)
Charles Douglas, Staffing, aware pt is moving from B15 to C20.
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
## 2015-09-02 NOTE — Progress Notes (Signed)
Received report from Clare, Charity fundraiser.   TTS to start assessment.    Janann Colonel. MSW, LCSW Therapeutic Triage Services-Triage Specialist   Phone: [phone]
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
## 2015-09-02 NOTE — Plan of Care (Signed)
[facility] Observation Crisis Plan  Reason for Crisis Plan:  Crisis Stabilization   Plan of Care:  Referral for Substance Abuse  Family Support:      Current Living Environment:   homeless  Insurance:   Hospital Account    Name Acct ID Class Status Primary Coverage   Aarnav, [place] [id number] BEHAVIORAL HEALTH OBSERVATION Open [employer] - [employer]        Guarantor Account (for Hospital Account [id number])    Name Relation to Pt Service Area Active? Acct Type   Raelyn Number Self [facility] Yes Behavioral Health   Address Phone       homeless [place], [place] [address] [phone]([phone]          Coverage Information (for Hospital Account [id number])    F/O Payor/Plan Precert #   [employer]-W[employer]-WAY    Subscriber Subscriber #   Kule, [place] [id number]   Address Phone   [address] [place], [place] [address] [phone]      Legal Guardian:     Primary Care Luz Mares:  No primary care Zadie Deemer on file.  Current Outpatient Providers:  Vesta Mixer  Psychiatrist:     Counselor/Therapist:     Compliant with Medications:  No  Additional Information:   Celene Kras 9/25/20165:37 PM
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
## 2015-09-02 NOTE — ED Notes (Signed)
Per Ellis Savage, [facility] - pt accepted to [facility] Obs Unit 1 - Dr Lucianne Muss accepting.

## 2015-09-02 NOTE — Progress Notes (Signed)
Pt presents to Observation Unit requesting help with Alcohol, cocaine and depression. +SI w/plan to jump out in front of car, -HI/A/V/H. Reports stressors being relapsing on cocaine, financial issues, out of medication and being homeless. "I feel worthless and depressed". Pt also reported his depression began when his father was getting sick and had to be put in a nursing home last year "I couldn't do anything for him".  Emotional support and encouragement given. Will monitor closely and evaluate for stabilization.

## 2015-09-02 NOTE — ED Notes (Signed)
Consulting civil engineer and staffing notified of patient.

## 2015-09-02 NOTE — ED Notes (Signed)
Dr Schlossman in w/pt. 

## 2015-09-02 NOTE — Progress Notes (Signed)
Clinician consulted with Nanine Means, NP who states that patient meets inpatient criteria. TTS to seek placement at surrounding facilities.    Janann Colonel. MSW, LCSW Therapeutic Triage Services-Triage Specialist   Phone: [phone]

## 2015-09-02 NOTE — Progress Notes (Signed)
[facility] INPATIENT:  Family/Significant Other Suicide Prevention Education  Suicide Prevention Education:  Patient Refusal for Family/Significant Other Suicide Prevention Education: The patient Charles Douglas has refused to provide written consent for family/significant other to be provided Family/Significant Other Suicide Prevention Education during admission and/or prior to discharge.    Celene Kras 09/02/2015, 5:55 PM

## 2015-09-02 NOTE — ED Notes (Signed)
Patient with chest pain, sharp in nature, with shortness of breath and vomiting.  Patient also states that he is suicidal with a plan to step out in front of a car.  Patient is calm and cooperative in triage.

## 2015-09-02 NOTE — ED Notes (Signed)
Pt signed consent forms - faxed to [facility] and copy sent to med records.

## 2015-09-02 NOTE — ED Notes (Signed)
[employer] en route.

## 2015-09-02 NOTE — ED Notes (Signed)
TTS machine taken to room - pt aware Tammy Sours, SW, [facility], to perform assessment soon.

## 2015-09-03 DIAGNOSIS — F1994 Other psychoactive substance use, unspecified with psychoactive substance-induced mood disorder: Secondary | ICD-10-CM

## 2015-09-03 DIAGNOSIS — R45851 Suicidal ideations: Secondary | ICD-10-CM

## 2015-09-03 LAB — GLUCOSE, CAPILLARY: GLUCOSE-CAPILLARY: 104 mg/dL — AB (ref 65–99)
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
## 2015-09-03 NOTE — BH Assessment (Signed)
Per Earlene Plater NP patient will be observed for another 24 hours for medication evaluation/adjustments. Patient was not feeling well this a.m. stating they felt "dizzy" and had recent medication changes. Patient is currently homeless and since he lost his residence his depression and SA use has increased. Patient states he continues to be suicidal with a plan to walk out in front of traffic. Patient also endorses symptoms associated with cocaine withdrawal that include: fatigue and agitation. Patient will be re-evaluated in the a.m.

## 2015-09-03 NOTE — BH Assessment (Signed)
  Patient was offered group therapy this date but declined.

## 2015-09-03 NOTE — Progress Notes (Signed)
D: Patient in bed awake on approach.  Patient states he had a rough day.  Patient states he woke up this AM weak and not feeling himself.  Patient states his appetite has come back and he has increases his food and fluid intake and feels better.  Patient states he is passive SI but verbally contracts for safety.  Patient denies HI and denies AVH.  Patient had an order for labs tonight and labs were not done.  Donell Sievert notified and labs deferred until the AM.   A: Staff to monitor Q 15 mins for safety.  Encouragement and support offered.  Scheduled medications administered per orders.  Elavil held tonight per Donell Sievert PA. R: Patient remains safe on the unit.  Patient taking administered medications.

## 2015-09-03 NOTE — BHH Counselor (Signed)
During consult with Charles Bame, NP and patient, the patient expressed current SI with plan or intent to "jump in front of a car". Patient expressed and requested to receive help with substance abuse. Notably, the patient expressed concerns over homelessness as well and has no residence. Patient was indecisive on location area, but did express perhaps staying within the [facility].  Shean K. Tiburcio Pea, MS, NCC, LPC-A  Counselor 09/03/2015 12:48 AM

## 2015-09-03 NOTE — Progress Notes (Signed)
D: Patient is alert this morning and mildly confused. Pt reports lethargy, speech is clear. Pt's mood and affect are depressed. Pt denies SI, stating "not right now." Pt denies HI and AVH. Pt remains resting and sleeping the majority of the shift. Pt's pulse 44 at 1130, pt complains of dizziness, mild confusion, pt alert and oriented x3, not oriented to date, pt reports it's "sunday." Pt continues to complain of not feeling well throughout the day, with general malaise. A: Active listening by RN. Encouragement/Support provided to pt. NP Charisse March. made aware of pt's pulse, pt encouraged to increase fluids, fluids given. Scheduled medications administered per providers orders (See MAR). 15 minute checks continued per protocol for patient safety.  R: Patient cooperative and receptive to nursing interventions. Pt remains safe.
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
## 2015-09-04 LAB — BASIC METABOLIC PANEL
Anion gap: 4 — ABNORMAL LOW (ref 5–15)
BUN: 10 mg/dL (ref 6–20)
CHLORIDE: 108 mmol/L (ref 101–111)
CO2: 27 mmol/L (ref 22–32)
CREATININE: 1.06 mg/dL (ref 0.61–1.24)
Calcium: 8.6 mg/dL — ABNORMAL LOW (ref 8.9–10.3)
GFR calc non Af Amer: >60 mL/min (ref >60)
Glucose, Bld: 131 mg/dL — ABNORMAL HIGH (ref 65–99)
POTASSIUM: 3.6 mmol/L (ref 3.5–5.1)
SODIUM: 139 mmol/L (ref 135–145)

## 2015-09-04 LAB — MAGNESIUM: MAGNESIUM: 2.2 mg/dL (ref 1.7–2.4)

## 2015-09-04 MED ORDER — RISPERIDONE 1 MG PO TABS: 1.0000 mg | ORAL_TABLET | Freq: Every day | ORAL | Status: DC

## 2015-09-04 MED ORDER — GABAPENTIN 300 MG PO CAPS: 300.0000 mg | ORAL_CAPSULE | Freq: Three times a day (TID) | ORAL | Status: DC

## 2015-09-04 MED ORDER — TRAZODONE HCL 50 MG PO TABS: 50.0000 mg | ORAL_TABLET | Freq: Every evening | ORAL | Status: DC | PRN

## 2015-09-04 MED ORDER — FLUOXETINE HCL 20 MG PO CAPS: 20.0000 mg | ORAL_CAPSULE | Freq: Every day | ORAL | Status: DC
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
## 2015-09-04 NOTE — Progress Notes (Signed)
D: Patient resting in bed with eyes closed.  Respirations even and unlabored.  Patient appears to be in no apparent distress. A: Staff to monitor Q 15 mins for safety.   R:Patient remains safe on the unit.  

## 2015-09-04 NOTE — Progress Notes (Signed)
Patient ID: Charles Douglas, male   DOB: 07/23/1971, 44 y.o.   MRN: [id number] D  --- Pt. said he no longer takes --- Amitriptyline ( Elavil )--- because  " it effected my vision and made me feel faint,  I passed out  Yesterday after it was given to me ".   Pt. Advised that he has the right to refuse any future  Offers of this medication.  He voiced understanding and agreed.

## 2015-09-04 NOTE — Progress Notes (Signed)
Patient ID: Charles Douglas, male   DOB: 1971/03/27, 44 y.o.   MRN: [id number] DIS - CHARGE  NOTE  ---   DC pt as ordered.  1200 hrs. Medication provided before DC.  All possessions were returned.  Pt. Denied  Pain , SI/HI/ and A/V hallucinations  And agreed to contract for safety.   Pt. Was labile and stated that he was - "going to see my lawyer and sue this place [facility] ) for giving me Elavil. " -  Pt. Had only advised staff this AM before DC about the allergy and it was immediately entered into his medical record at that time.  Pt. Was provided bus passes to [facility].  Pt. Was safe when he left [facility] enroute to bus stop at [facility].  ---  A ----  Escort pt. To front lobby at 1240 hrs. , 09/04/15.  --- R --  Pt. Was safe but labile at time of DC

## 2015-09-04 NOTE — [facility] Counselor (Signed)
Per Donell Sievert, PA pt needs to have labs done in the AM and also be re-evaluated by an AM extender to determine deposition. Pt vitals have improved and he has attempted to eat something and increase his fluid intake. Patient states he is passive SI but verbally contracts for safety. Patient denies HI and denies AVH. This Clinical research associate will continue to provide encouragement and support to pt throughout the remainder of the shift. No resources were provided to pt at this time.  Ardelle Park, MA OBS Counselor

## 2015-09-04 NOTE — [facility] Counselor (Signed)
[facility] Assessment Progress Note  Pt seen by Fransisca Kaufmann, NP. Plan is for pt to be d/c with OP resources.   Pt requests to go back to [facility]. He shared that he has family in [place], but he doesn't want to go there b/c of lack of jobs. 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 Pt indicates being homeless, but working with [facility] thru their [facility] program. Counselor attempted to call them for further details [phone]), but there was no response. When asked about details and time-frames for placement thru this housing program, pt repeatedly responded, "it takes time" and "not an overnight process", but confirmed that he was in their system and they were working with him. Pt did not present with any signs of distress when discussing his current housing situation and appeared confident in the fact that he had some things in process. Pt acknowledged, upon inquiry, that he has a few friends in [facility] where he could go.    Pt indicated that he ran out of his medications about 3-4 days ago. Pt shared that he gets his prescriptions thru [facility] [place], but has not been there since July, b/c he has enough rx and medications to last him until now. Pt acknowledges that he needs to go back to [facility] for medication management. Pt says he receives his medications from [facility], also in [place], as [facility] doesn't have a pharmacy.   Pt is amenable to and ready for d/c. He will take GTA to [facility] and then transfer to [facility]. Pt states that he has done this before and is aware of what to do.   Johny Shock. Ladona Ridgel, MS, NCC, LPCA Counselor

## 2015-09-04 NOTE — Progress Notes (Addendum)
Patient ID: Claiborne Billings, male   DOB: [birth year], 44 y.o.   MRN: [id number] D    ---  Pt. Denies pain or dis-comfort at this time.   He has taken his AM medications with no adverse effects.  He denies  Feeling dizzy and has steady gait.  Pt. ate 2 breakfast trays this AM  And drinks fluid as encouraged.    Pt. States that  " I want to get meds straightened out ".    Pt. Contracts for safety and stays in bed sleeping  With periods of being awake.  He has minimal communication but is calm and friendly on approach.  He is alert and oriented  And makes insightful statements about his care.   Pt. Met with NP, Rosana Hoes this AM to discuss DC plans.    Pt. said Wishes to be DCd  to  " [facility] "  In [place]  --- A  --  Support, safety cks and meds provided.  --- R ---   Pt. Remains safe omn observation unit

## 2016-12-21 IMAGING — DX DG CHEST 2V
2 series · 2 of 2 positions shown · non-contrast
Comparison: 02/08/2014

CLINICAL DATA: Chest pain, shortness of breath x3 days

EXAM:
CHEST  2 VIEW

[chest pa]
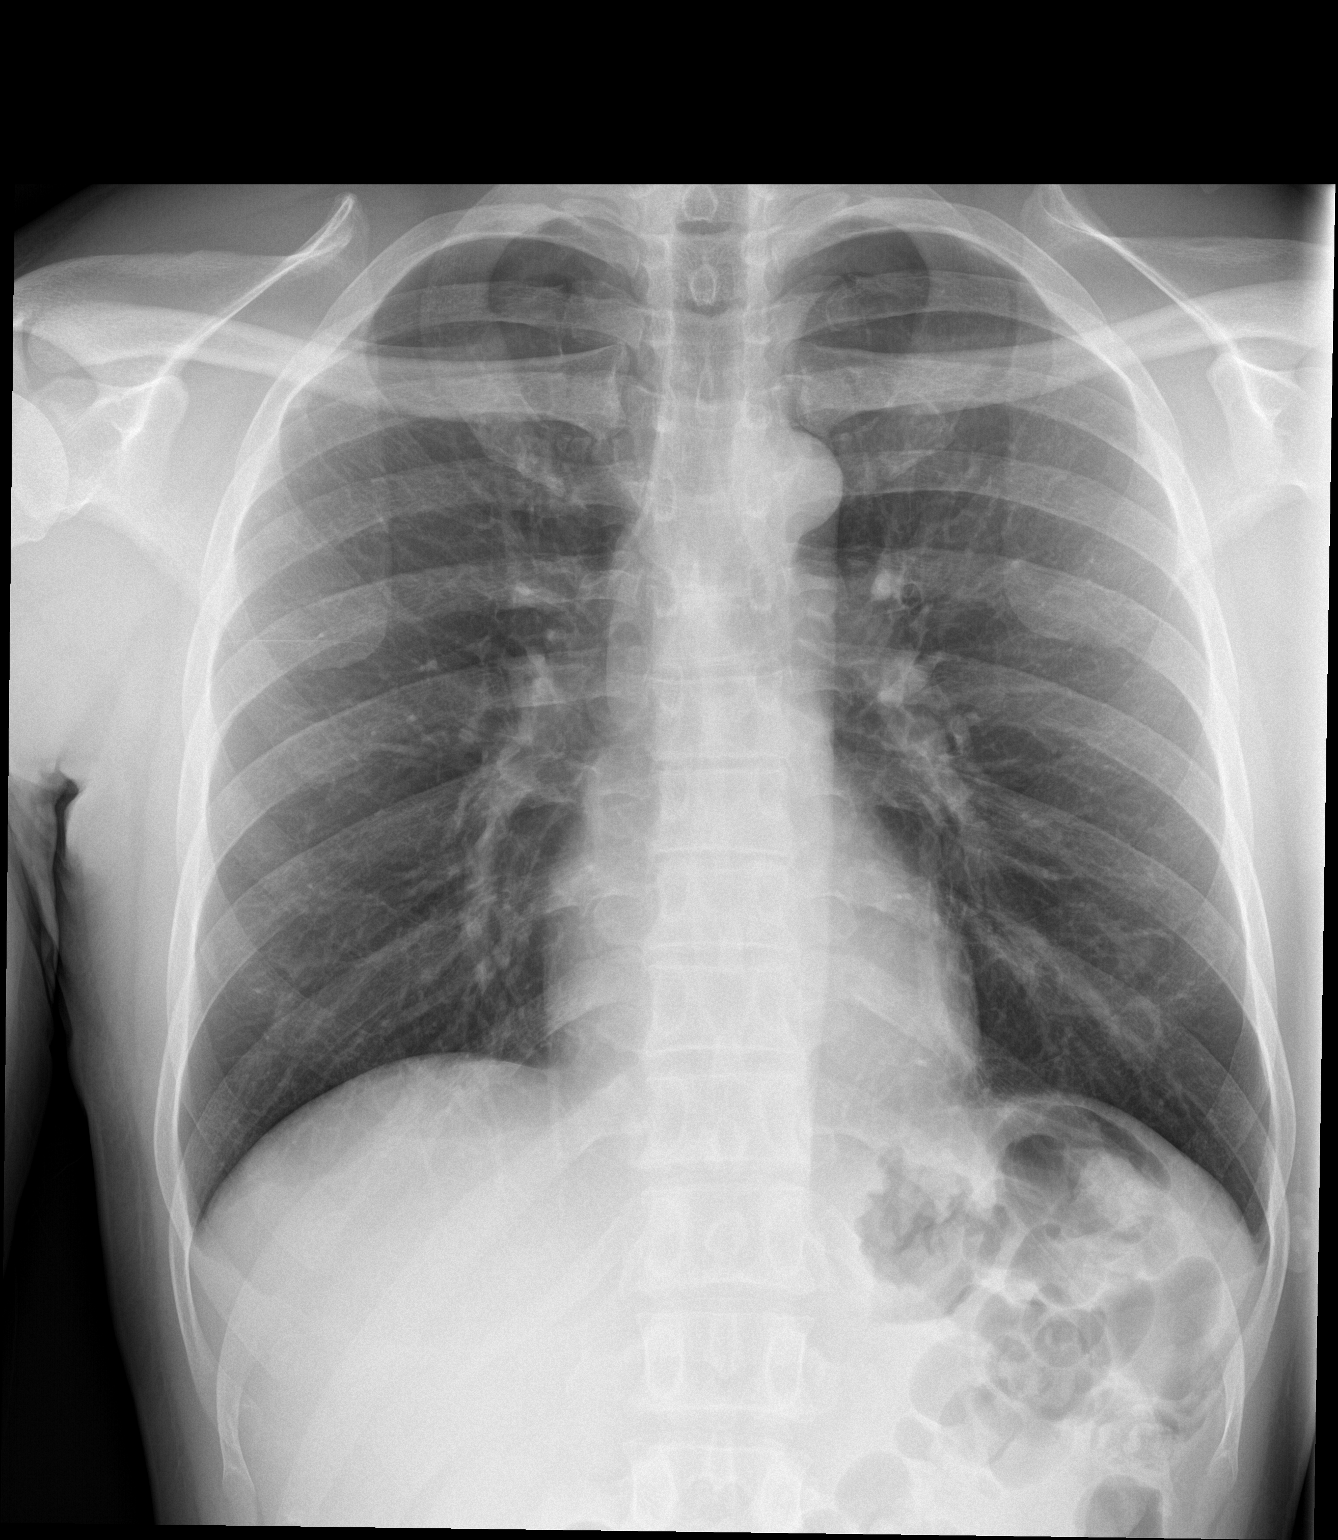

[chest lat]
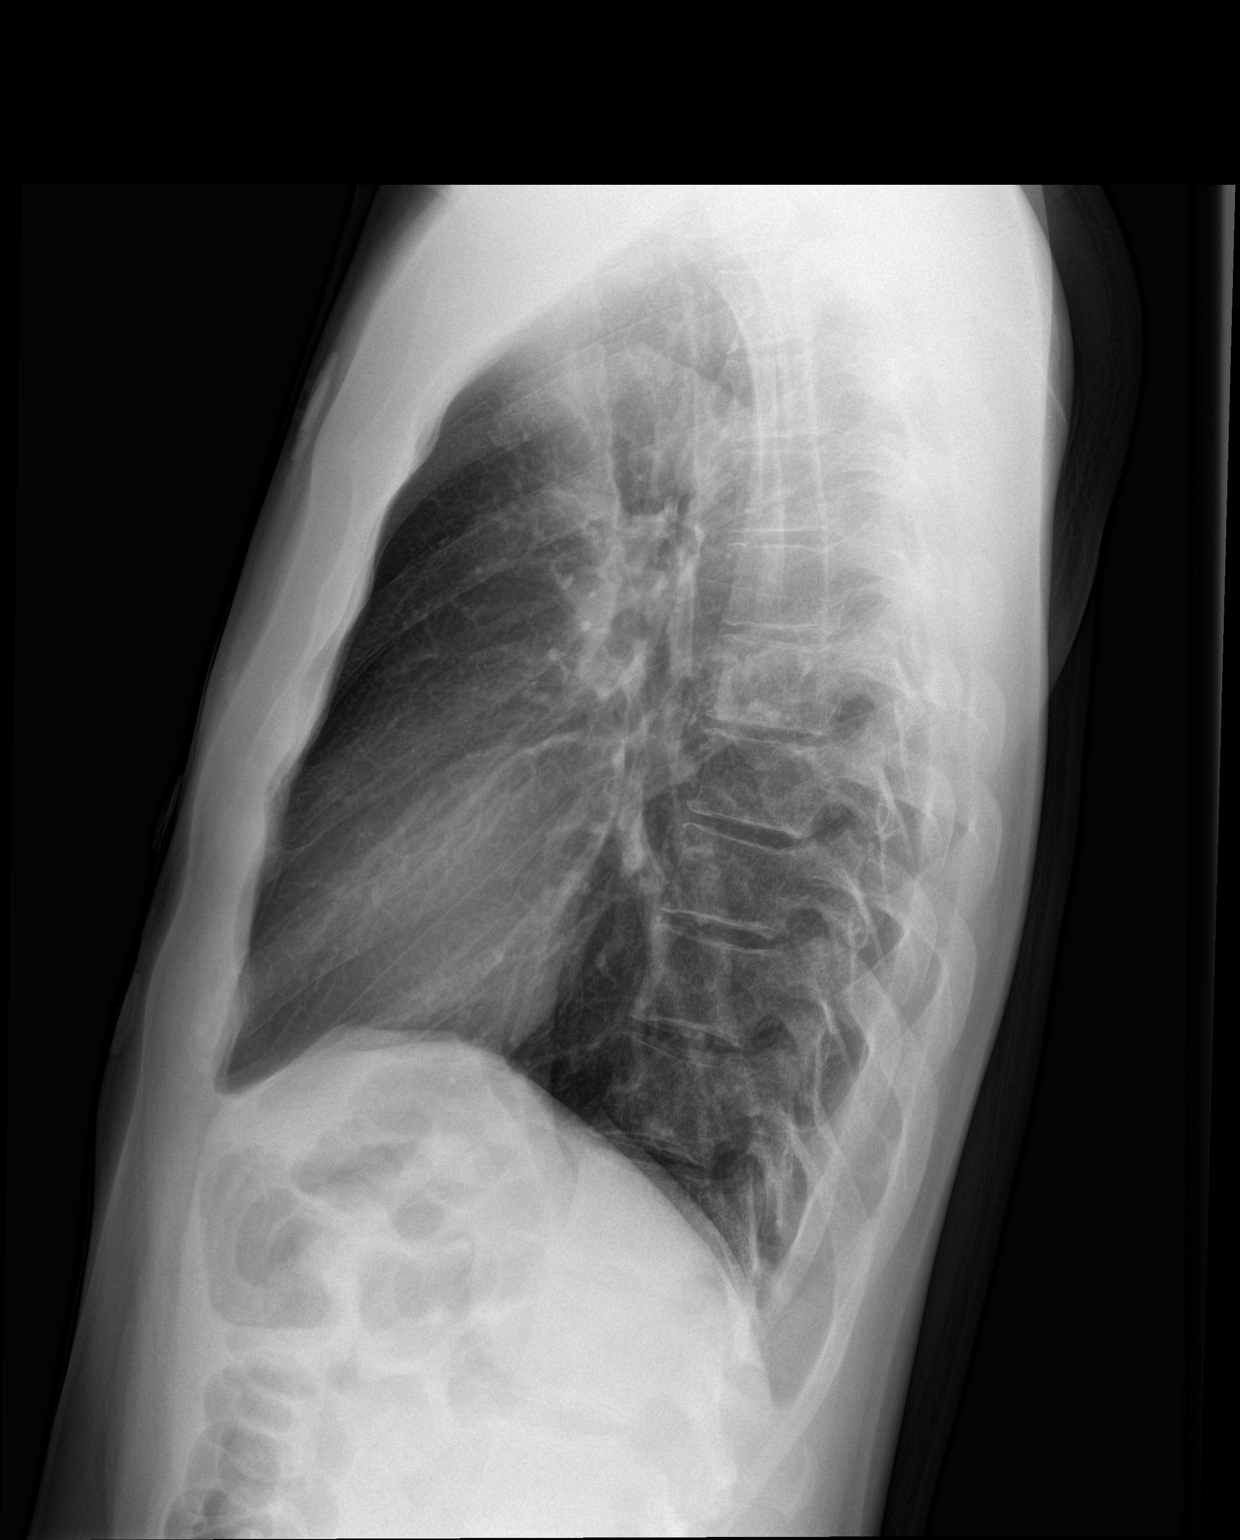

[2 of 2 positions shown; findings below may reference images not displayed]

FINDINGS: Lungs are clear.  No pleural effusion or pneumothorax.

The heart is normal in size.

Visualized osseous structures are within normal limits.
IMPRESSION: Normal chest radiographs.

## 2022-12-13 ENCOUNTER — Encounter (HOSPITAL_COMMUNITY): Payer: Self-pay

## 2022-12-13 ENCOUNTER — Other Ambulatory Visit: Payer: Self-pay

## 2022-12-13 ENCOUNTER — Emergency Department (HOSPITAL_COMMUNITY): Payer: 59

## 2022-12-13 ENCOUNTER — Emergency Department (HOSPITAL_COMMUNITY)
Admission: EM | Admit: 2022-12-13 | Discharge: 2022-12-14 | Disposition: A | Discharge status: Home / Self Care | Payer: 59 | Attending: Emergency Medicine | Admitting: Emergency Medicine

## 2022-12-13 DIAGNOSIS — I10 Essential (primary) hypertension: Secondary | ICD-10-CM | POA: Diagnosis not present

## 2022-12-13 DIAGNOSIS — R0789 Other chest pain: Secondary | ICD-10-CM | POA: Diagnosis not present

## 2022-12-13 DIAGNOSIS — R079 Chest pain, unspecified: Secondary | ICD-10-CM | POA: Diagnosis not present

## 2022-12-13 LAB — CBC
HCT: 41.5 % (ref 39.0–52.0)
Hemoglobin: 14.2 g/dL (ref 13.0–17.0)
MCH: 30.3 pg (ref 26.0–34.0)
MCHC: 34.2 g/dL (ref 30.0–36.0)
MCV: 88.7 fL (ref 80.0–100.0)
Platelets: 251 10*3/uL (ref 150–400)
RBC: 4.68 MIL/uL (ref 4.22–5.81)
RDW: 12 % (ref 11.5–15.5)
WBC: 9.6 10*3/uL (ref 4.0–10.5)
nRBC: 0 % (ref 0.0–0.2)

## 2022-12-13 LAB — COMPREHENSIVE METABOLIC PANEL
ALT: 68 U/L — ABNORMAL HIGH (ref 0–44)
AST: 63 U/L — ABNORMAL HIGH (ref 15–41)
Albumin: 4.2 g/dL (ref 3.5–5.0)
Alkaline Phosphatase: 51 U/L (ref 38–126)
Anion gap: 10 (ref 5–15)
BUN: 20 mg/dL (ref 6–20)
CO2: 24 mmol/L (ref 22–32)
Calcium: 8.9 mg/dL (ref 8.9–10.3)
Chloride: 103 mmol/L (ref 98–111)
Creatinine, Ser: 0.98 mg/dL (ref 0.61–1.24)
GFR, Estimated: >60 mL/min (ref >60)
Glucose, Bld: 84 mg/dL (ref 70–99)
Potassium: 3.3 mmol/L — ABNORMAL LOW (ref 3.5–5.1)
Sodium: 137 mmol/L (ref 135–145)
Total Bilirubin: 0.7 mg/dL (ref 0.3–1.2)
Total Protein: 8.2 g/dL — ABNORMAL HIGH (ref 6.5–8.1)

## 2022-12-13 LAB — TROPONIN I (HIGH SENSITIVITY)
Troponin I (High Sensitivity): 7 ng/L (ref <18)
Troponin I (High Sensitivity): 8 ng/L (ref <18)

## 2022-12-13 LAB — MAGNESIUM: Magnesium: 2.2 mg/dL (ref 1.7–2.4)

## 2022-12-13 MED ORDER — ASPIRIN 81 MG PO CHEW: 324.0000 mg | CHEWABLE_TABLET | Freq: Once | ORAL | Status: DC

## 2022-12-13 NOTE — Discharge Instructions (Addendum)
As discussed, your evaluation today has been largely reassuring.  But, it is important that you monitor your condition carefully, and do not hesitate to return to the ED if you develop new, or concerning changes in your condition. ? ?Otherwise, please follow-up with your physician for appropriate ongoing care. ? ?

## 2022-12-13 NOTE — ED Triage Notes (Signed)
Patient arrives via EMS for chest pain. Patient states that he has been having chest pain since noon, noticed that it is worse with exertion. Patient states he has family history of HTN, but has never been diagnosed himself. Blood pressure with EMS was 200s/100s. EMS administered 324 of ASA and 1 sublingual NTG. Patient states NTG helped "a little bit"

## 2022-12-13 NOTE — ED Provider Notes (Signed)
[facility] EMERGENCY DEPARTMENT Provider Note   CSN: [id number] Arrival date & time: 12/13/22  1948     History  Chief Complaint  Patient presents with   Chest Pain    Charles Douglas is a 52 y.o. male.  Patient p/w cp x 1 d.  No hx of cardiovascular disease.  He does have HTN, and prior substance abuse - none recently.  He arrives via EMS, who note no distress en route.       Home Medications Prior to Admission medications   Medication Sig Start Date End Date Taking? Authorizing Provider  FLUoxetine (PROZAC) 20 MG capsule Take 1 capsule (20 mg total) by mouth daily. 09/04/15   Thermon Leyland, NP  gabapentin (NEURONTIN) 300 MG capsule Take 1 capsule (300 mg total) by mouth 3 (three) times daily. 09/04/15   Thermon Leyland, NP  risperiDONE (RISPERDAL) 1 MG tablet Take 1 tablet (1 mg total) by mouth at bedtime. 09/04/15   Thermon Leyland, NP  traZODone (DESYREL) 50 MG tablet Take 1 tablet (50 mg total) by mouth at bedtime as needed for sleep. 09/04/15   Thermon Leyland, NP      Allergies    Amitriptyline and Trazodone and nefazodone    Review of Systems   Review of Systems  All other systems reviewed and are negative.   Physical Exam Updated Vital Signs BP (!) 162/97   Pulse 70   Temp 99 F (37.2 C) (Oral)   Resp 19   Ht 5\' 7"  (1.702 m)   Wt 83.5 kg   SpO2 100%   BMI 28.82 kg/m  Physical Exam Vitals and nursing note reviewed.  Constitutional:      General: He is not in acute distress.    Appearance: He is well-developed.  HENT:     Head: Normocephalic and atraumatic.  Eyes:     Conjunctiva/sclera: Conjunctivae normal.  Cardiovascular:     Rate and Rhythm: Normal rate and regular rhythm.  Pulmonary:     Effort: Pulmonary effort is normal. No respiratory distress.     Breath sounds: No stridor.  Abdominal:     General: There is no distension.  Skin:    General: Skin is warm and dry.  Neurological:     Mental Status: He is alert and  oriented to person, place, and time.     ED Results / Procedures / Treatments   Labs (all labs ordered are listed, but only abnormal results are displayed) Labs Reviewed  COMPREHENSIVE METABOLIC PANEL - Abnormal; Notable for the following components:      Result Value   Potassium 3.3 (*)    Total Protein 8.2 (*)    AST 63 (*)    ALT 68 (*)    All other components within normal limits  CBC  MAGNESIUM  CBG MONITORING, ED  TROPONIN I (HIGH SENSITIVITY)  TROPONIN I (HIGH SENSITIVITY)    EKG EKG Interpretation  Date/Time:  Saturday December 13 2022 19:54:02 EST Ventricular Rate:  63 PR Interval:    QRS Duration: 102 QT Interval:  431 QTC Calculation: 442 R Axis:   40 Text Interpretation: Sinus rhythm Artifact Abnormal ECG Confirmed by [id number] [phone]) on 12/13/2022 11:23:07 PM  Radiology DG Chest Portable 1 View  Result Date: 12/13/2022 CLINICAL DATA:  Chest pain EXAM: PORTABLE CHEST 1 VIEW COMPARISON:  09/02/2015 FINDINGS: The heart size and mediastinal contours are within normal limits. Both lungs are clear. The visualized skeletal structures  are unremarkable. IMPRESSION: No active disease. Electronically Signed   By: Placido Sou M.D.   On: 12/13/2022 20:06    Procedures Procedures    Medications Ordered in ED Medications  aspirin chewable tablet 324 mg (324 mg Oral Not Given 12/13/22 2034)    ED Course/ Medical Decision Making/ A&P                           Medical Decision Making Patient w Hx of subs abuse, HTN, now p/w CP.  R/O ACS, HTN urgency, musculoskeletal, GI, less likely aortic disruption w no neuro changes / complaints, no back pain, equal pulses.  ASA en route, labs, monitoring here.  Cardio: 65 sr, nml  O2- 99%RA, nml   Amount and/or Complexity of Data Reviewed Independent Historian: EMS External Data Reviewed: notes. Labs: ordered. Decision-making details documented in ED Course. Radiology: ordered. Decision-making details documented in  ED Course.  Risk OTC drugs.   11:51 PM Patient awake, alert, watching basketball, no ongoing complaints.  He has mild hypertension but no evidence for endorgan damage, to doing the troponins, nonischemic EKG, low evidence for ACS, no evidence for acute new findings.  Patient appropriate for close outpatient follow-up.   Final Clinical Impression(s) / ED Diagnoses Final diagnoses:  Atypical chest pain     Carmin Muskrat, MD 12/14/22 0000

## 2023-03-03 ENCOUNTER — Other Ambulatory Visit: Payer: Self-pay

## 2023-03-03 ENCOUNTER — Emergency Department (HOSPITAL_COMMUNITY): Payer: 59

## 2023-03-03 ENCOUNTER — Emergency Department (HOSPITAL_COMMUNITY)
Admission: EM | Admit: 2023-03-03 | Discharge: 2023-03-03 | Disposition: A | Discharge status: Home / Self Care | Payer: 59 | Attending: Emergency Medicine | Admitting: Emergency Medicine

## 2023-03-03 ENCOUNTER — Encounter (HOSPITAL_COMMUNITY): Payer: Self-pay

## 2023-03-03 DIAGNOSIS — R0981 Nasal congestion: Secondary | ICD-10-CM | POA: Insufficient documentation

## 2023-03-03 DIAGNOSIS — R079 Chest pain, unspecified: Secondary | ICD-10-CM | POA: Diagnosis not present

## 2023-03-03 DIAGNOSIS — R519 Headache, unspecified: Secondary | ICD-10-CM | POA: Insufficient documentation

## 2023-03-03 DIAGNOSIS — R059 Cough, unspecified: Secondary | ICD-10-CM | POA: Diagnosis not present

## 2023-03-03 DIAGNOSIS — Z1152 Encounter for screening for COVID-19: Secondary | ICD-10-CM | POA: Insufficient documentation

## 2023-03-03 DIAGNOSIS — R0789 Other chest pain: Secondary | ICD-10-CM | POA: Diagnosis not present

## 2023-03-03 HISTORY — DX: Post-traumatic stress disorder, unspecified: F43.10

## 2023-03-03 HISTORY — DX: Bipolar disorder, unspecified: F31.9

## 2023-03-03 LAB — BASIC METABOLIC PANEL
Anion gap: 9 (ref 5–15)
BUN: 19 mg/dL (ref 6–20)
CO2: 26 mmol/L (ref 22–32)
Calcium: 8.9 mg/dL (ref 8.9–10.3)
Chloride: 101 mmol/L (ref 98–111)
Creatinine, Ser: 0.97 mg/dL (ref 0.61–1.24)
GFR, Estimated: >60 mL/min (ref >60)
Glucose, Bld: 108 mg/dL — ABNORMAL HIGH (ref 70–99)
Potassium: 3.8 mmol/L (ref 3.5–5.1)
Sodium: 136 mmol/L (ref 135–145)

## 2023-03-03 LAB — CBC
HCT: 42.3 % (ref 39.0–52.0)
Hemoglobin: 14.2 g/dL (ref 13.0–17.0)
MCH: 30.1 pg (ref 26.0–34.0)
MCHC: 33.6 g/dL (ref 30.0–36.0)
MCV: 89.6 fL (ref 80.0–100.0)
Platelets: 204 10*3/uL (ref 150–400)
RBC: 4.72 MIL/uL (ref 4.22–5.81)
RDW: 11.8 % (ref 11.5–15.5)
WBC: 9.3 10*3/uL (ref 4.0–10.5)
nRBC: 0 % (ref 0.0–0.2)

## 2023-03-03 LAB — HEPATIC FUNCTION PANEL
ALT: 49 U/L — ABNORMAL HIGH (ref 0–44)
AST: 42 U/L — ABNORMAL HIGH (ref 15–41)
Albumin: 4.3 g/dL (ref 3.5–5.0)
Alkaline Phosphatase: 58 U/L (ref 38–126)
Bilirubin, Direct: 0.2 mg/dL (ref 0.0–0.2)
Indirect Bilirubin: 0.8 mg/dL (ref 0.3–0.9)
Total Bilirubin: 1 mg/dL (ref 0.3–1.2)
Total Protein: 8.5 g/dL — ABNORMAL HIGH (ref 6.5–8.1)

## 2023-03-03 LAB — RESP PANEL BY RT-PCR (RSV, FLU A&B, COVID)  RVPGX2
Influenza A by PCR: NEGATIVE
Influenza B by PCR: NEGATIVE
Resp Syncytial Virus by PCR: NEGATIVE
SARS Coronavirus 2 by RT PCR: NEGATIVE

## 2023-03-03 LAB — LIPASE, BLOOD: Lipase: 32 U/L (ref 11–51)

## 2023-03-03 LAB — MAGNESIUM: Magnesium: 2.4 mg/dL (ref 1.7–2.4)

## 2023-03-03 LAB — TROPONIN I (HIGH SENSITIVITY)
Troponin I (High Sensitivity): 18 ng/L — ABNORMAL HIGH (ref <18)
Troponin I (High Sensitivity): 20 ng/L — ABNORMAL HIGH (ref <18)

## 2023-03-03 MED ORDER — ASPIRIN 81 MG PO CHEW
162.0000 mg | CHEWABLE_TABLET | Freq: Once | ORAL | Status: AC
  Administered 2023-03-03: 162 mg via ORAL
  Filled 2023-03-03: qty 2

## 2023-03-03 MED ORDER — LIDOCAINE VISCOUS HCL 2 % MT SOLN
15.0000 mL | Freq: Once | OROMUCOSAL | Status: AC
  Administered 2023-03-03: 15 mL via ORAL
  Filled 2023-03-03: qty 15

## 2023-03-03 MED ORDER — OXYCODONE-ACETAMINOPHEN 5-325 MG PO TABS
1.0000 | ORAL_TABLET | Freq: Once | ORAL | Status: AC
  Administered 2023-03-03: 1 via ORAL
  Filled 2023-03-03: qty 1

## 2023-03-03 MED ORDER — FAMOTIDINE 20 MG PO TABS: 20.0000 mg | ORAL_TABLET | Freq: Every day | ORAL | 0 refills | Status: AC

## 2023-03-03 MED ORDER — ALUM & MAG HYDROXIDE-SIMETH 200-200-20 MG/5ML PO SUSP
30.0000 mL | Freq: Once | ORAL | Status: AC
  Administered 2023-03-03: 30 mL via ORAL
  Filled 2023-03-03: qty 30

## 2023-03-03 MED ORDER — FAMOTIDINE IN NACL 20-0.9 MG/50ML-% IV SOLN
20.0000 mg | Freq: Once | INTRAVENOUS | Status: AC
  Administered 2023-03-03: 20 mg via INTRAVENOUS
  Filled 2023-03-03: qty 50

## 2023-03-03 MED ORDER — ASPIRIN 325 MG PO TABS
ORAL_TABLET | ORAL | Status: AC
  Filled 2023-03-03: qty 1

## 2023-03-03 NOTE — ED Triage Notes (Signed)
Pt reports worsening intermittent headache over past week with centralized sharp chest pain beginning 2 days ago that has been intermittent. Pt reports fatigue and cough as well. Denies fever, N/V/D, lightheadedness, SOB, weakness.

## 2023-03-03 NOTE — ED Provider Notes (Signed)
[facility] Provider Note   CSN: [id number] Arrival date & time: 03/03/23  0451     History  Chief Complaint  Patient presents with   Headache   Chest Pain    Charles Douglas is a 52 y.o. male.   Headache Associated symptoms: congestion and cough   Chest Pain Associated symptoms: cough and headache   Patient presents for URI symptoms and chest pain.  Medical history includes bipolar disorder, depression, polysubstance abuse.  He has had congestion, runny nose, cough, and intermittent headache over the past week.  Over the past 2 days, he has had intermittent central chest pain.  This is not worsened postprandially or with exertion.  He does feel that it worsens when he lays down.  Early this morning, at around 2 AM, he had recurrence of this chest pain.  He reports that it has eased off and is currently 8/10 in severity.  It does not radiate.  Patient has no known cardiac history.  He denies tobacco use.  He does have a father who had MI in his 30s.  Patient took to 81 mg aspirin at home.  He has not taken anything else today for symptomatic relief.     Home Medications Prior to Admission medications   Medication Sig Start Date End Date Taking? Authorizing Provider  citalopram (CELEXA) 20 MG tablet Take 20 mg by mouth daily.   Yes [provider]  famotidine (PEPCID) 20 MG tablet Take 1 tablet (20 mg total) by mouth daily. 03/03/23  Yes Godfrey Pick, MD  hydrOXYzine (ATARAX) 25 MG tablet Take 25 mg by mouth daily. 03/02/23  Yes [provider]      Allergies    Bee venom, Penicillin g, Amitriptyline, and Trazodone and nefazodone    Review of Systems   Review of Systems  HENT:  Positive for congestion and rhinorrhea.   Respiratory:  Positive for cough.   Cardiovascular:  Positive for chest pain.  Neurological:  Positive for headaches.  All other systems reviewed and are negative.   Physical Exam Updated  Vital Signs BP (!) 159/87   Pulse 71   Temp 97.6 F (36.4 C)   Resp 19   Ht 5' 7.5" (1.715 m)   Wt 83.5 kg   SpO2 100%   BMI 28.39 kg/m  Physical Exam Vitals and nursing note reviewed.  Constitutional:      General: He is not in acute distress.    Appearance: He is well-developed and normal weight. He is not ill-appearing, toxic-appearing or diaphoretic.  HENT:     Head: Normocephalic and atraumatic.     Mouth/Throat:     Mouth: Mucous membranes are moist.  Eyes:     General: No scleral icterus.    Extraocular Movements: Extraocular movements intact.     Conjunctiva/sclera: Conjunctivae normal.  Cardiovascular:     Rate and Rhythm: Normal rate and regular rhythm.     Heart sounds: No murmur heard. Pulmonary:     Effort: Pulmonary effort is normal. No respiratory distress.     Breath sounds: Normal breath sounds. No wheezing or rales.  Chest:     Chest wall: No tenderness.  Abdominal:     General: There is no distension.     Palpations: Abdomen is soft.     Tenderness: There is no abdominal tenderness.  Musculoskeletal:        General: No swelling. Normal range of motion.  Cervical back: Neck supple.  Skin:    General: Skin is warm and dry.     Coloration: Skin is not cyanotic or pale.  Neurological:     Mental Status: He is alert and oriented to person, place, and time.     Cranial Nerves: No cranial nerve deficit, dysarthria or facial asymmetry.     Sensory: No sensory deficit.     Motor: No weakness.  Psychiatric:        Mood and Affect: Mood normal.        Speech: Speech normal.        Behavior: Behavior normal.     ED Results / Procedures / Treatments   Labs (all labs ordered are listed, but only abnormal results are displayed) Labs Reviewed  BASIC METABOLIC PANEL - Abnormal; Notable for the following components:      Result Value   Glucose, Bld 108 (*)    All other components within normal limits  HEPATIC FUNCTION PANEL - Abnormal; Notable for the  following components:   Total Protein 8.5 (*)    AST 42 (*)    ALT 49 (*)    All other components within normal limits  TROPONIN I (HIGH SENSITIVITY) - Abnormal; Notable for the following components:   Troponin I (High Sensitivity) 18 (*)    All other components within normal limits  TROPONIN I (HIGH SENSITIVITY) - Abnormal; Notable for the following components:   Troponin I (High Sensitivity) 20 (*)    All other components within normal limits  RESP PANEL BY RT-PCR (RSV, FLU A&B, COVID)  [id number]  CBC  MAGNESIUM  LIPASE, BLOOD    EKG EKG Interpretation  Date/Time:  Tuesday March 03 2023 05:16:09 EDT Ventricular Rate:  78 PR Interval:  166 QRS Duration: 96 QT Interval:  390 QTC Calculation: 445 R Axis:   19 Text Interpretation: Sinus rhythm Baseline wander in lead(s) III  Otherwise within normal limits  When compared with ECG of  12/13/2022,  T wave abnormality is no longer present  Confirmed by Delora Fuel ([id number]) on 03/03/2023 6:46:55 AM  Radiology CT Head Wo Contrast  Result Date: 03/03/2023 CLINICAL DATA:  New onset headache. EXAM: CT HEAD WITHOUT CONTRAST TECHNIQUE: Contiguous axial images were obtained from the base of the skull through the vertex without intravenous contrast. RADIATION DOSE REDUCTION: This exam was performed according to the departmental dose-optimization program which includes automated exposure control, adjustment of the mA and/or kV according to patient size and/or use of iterative reconstruction technique. COMPARISON:  06/01/2007 FINDINGS: Brain: Bilateral inferior frontal and anterior temporal encephalomalacia. Small right frontal cortex encephalomalacia. No hemorrhage, hydrocephalus, collection, or masslike finding. Vascular: No hyperdense vessel or unexpected calcification. Skull: Normal. Negative for fracture or focal lesion. Sinuses/Orbits: No acute finding. IMPRESSION: 1. No acute finding. 2. 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 Electronically Signed   By: Jorje Guild M.D.   On: 03/03/2023 07:34   DG Chest 2 View  Result Date: 03/03/2023 CLINICAL DATA:  Chest pain EXAM: CHEST - 2 VIEW COMPARISON:  12/13/2022 FINDINGS: The lungs are clear without focal pneumonia, edema, pneumothorax or pleural effusion. The cardiopericardial silhouette is within normal limits for size. The visualized bony structures of the thorax are unremarkable. Telemetry leads overlie the chest. IMPRESSION: No active cardiopulmonary disease. Electronically Signed   By: Misty Stanley M.D.   On: 03/03/2023 05:43    Procedures Procedures    Medications Ordered in ED Medications  alum & mag hydroxide-simeth (MAALOX/MYLANTA) 200-200-20  MG/5ML suspension 30 mL (30 mLs Oral Given 03/03/23 0748)    And  lidocaine (XYLOCAINE) 2 % viscous mouth solution 15 mL (15 mLs Oral Given 03/03/23 0748)  famotidine (PEPCID) IVPB 20 mg premix (0 mg Intravenous Stopped 03/03/23 1001)  oxyCODONE-acetaminophen (PERCOCET/ROXICET) 5-325 MG per tablet 1 tablet (1 tablet Oral Given 03/03/23 0748)  aspirin chewable tablet 162 mg (162 mg Oral Given 03/03/23 [id number])    ED Course/ Medical Decision Making/ A&P                             Medical Decision Making Amount and/or Complexity of Data Reviewed Labs: ordered. Radiology: ordered.  Risk OTC drugs. Prescription drug management.   This patient presents to the ED for concern of chest pain, this involves an extensive number of treatment options, and is a complaint that carries with it a high risk of complications and morbidity.  The differential diagnosis includes ACS, GERD, pericarditis, costochondritis, PUD, pancreatitis   Co morbidities that complicate the patient evaluation  bipolar disorder, depression, polysubstance abuse   Additional history obtained:  Additional history obtained from N/A External records from outside source obtained and reviewed including EMR   Lab Tests:  I  Ordered, and personally interpreted labs.  The pertinent results include: Normal kidney function, normal electrolytes, high-normal transaminases, normal hemoglobin, no leukocytosis, high-normal troponins with no significant change on repeat   Imaging Studies ordered:  I ordered imaging studies including chest x-ray, CT head I independently visualized and interpreted imaging which showed no acute findings I agree with the radiologist interpretation   Cardiac Monitoring: / EKG:  The patient was maintained on a cardiac monitor.  I personally viewed and interpreted the cardiac monitored which showed an underlying rhythm of: Sinus rhythm  Problem List / ED Course / Critical interventions / Medication management  Patient presents for intermittent headache and chest pains.  Prior to being bedded in the ED, diagnostic workup was initiated.  EKG shows no concerning ST segment changes.  Hemoglobin is normal.  No leukocytosis is present.  Electrolytes are unremarkable.  Initial troponin is high-normal.  Chest x-ray shows no acute findings.  On exam, patient is well-appearing.  He currently endorses 8/10 severity chest pain.  He denies current headache.  He does have a family history of early ACS.  Prior to arrival, he did take to 81 mg ASA's.  An additional 2 were ordered for possible ACS.  Percocet was ordered for analgesia.  Patient denies any worsening of his chest pain with exertion but does state that it worsens with laying down.  This could suggest GERD etiology.  GI cocktail was ordered.  Following GI cocktail, patient did have improvement in symptoms.  I suspect, at least partial, reflux etiology.  Repeat troponin showed no significant change.  Patient sustained improvement in symptoms.  Patient was prescribed Pepcid.  Given his family history, patient would also benefit from establishing care with cardiology.  Cardiology referral was ordered.  Patient was discharged in stable condition. I ordered  medication including Percocet for analgesia; GI cocktail for empiric treatment of reflux Reevaluation of the patient after these medicines showed that the patient improved I have reviewed the patients home medicines and have made adjustments as needed   Social Determinants of Health:  Does not have PCP        Final Clinical Impression(s) / ED Diagnoses Final diagnoses:  Chest pain, unspecified type    Rx /  DC Orders ED Discharge Orders          Ordered    famotidine (PEPCID) 20 MG tablet  Daily        03/03/23 1000    Ambulatory referral to Cardiology       Comments: If you have not heard from the Cardiology office within the next 72 hours please call [phone].   03/03/23 1000              Godfrey Pick, MD 03/03/23 1002

## 2023-03-03 NOTE — Discharge Instructions (Signed)
A prescription for Pepcid was sent to your pharmacy.  This is a medication that you take daily and can help with symptoms of reflux.  Because you have had recent intermittent chest pain and do have a family history of early heart disease, you would benefit from establishing care with a cardiologist.  A cardiology referral was ordered.  If you do not hear from cardiology office in the next 2 days, call the number below.  Return to the emergency department for any new or worsening symptoms of concern.

## 2023-03-04 ENCOUNTER — Emergency Department (HOSPITAL_COMMUNITY)
Admission: EM | Admit: 2023-03-04 | Discharge: 2023-03-04 | Disposition: A | Discharge status: Home / Self Care | Payer: 59 | Attending: Student | Admitting: Student

## 2023-03-04 ENCOUNTER — Other Ambulatory Visit: Payer: Self-pay

## 2023-03-04 DIAGNOSIS — R519 Headache, unspecified: Secondary | ICD-10-CM | POA: Insufficient documentation

## 2023-03-04 DIAGNOSIS — R0602 Shortness of breath: Secondary | ICD-10-CM | POA: Diagnosis not present

## 2023-03-04 DIAGNOSIS — R110 Nausea: Secondary | ICD-10-CM | POA: Diagnosis not present

## 2023-03-04 DIAGNOSIS — R0789 Other chest pain: Secondary | ICD-10-CM | POA: Diagnosis not present

## 2023-03-04 DIAGNOSIS — R079 Chest pain, unspecified: Secondary | ICD-10-CM | POA: Diagnosis not present

## 2023-03-04 LAB — TROPONIN I (HIGH SENSITIVITY): Troponin I (High Sensitivity): 15 ng/L (ref <18)

## 2023-03-04 MED ORDER — METOCLOPRAMIDE HCL 5 MG/ML IJ SOLN
10.0000 mg | Freq: Once | INTRAMUSCULAR | Status: AC
  Administered 2023-03-04: 10 mg via INTRAVENOUS
  Filled 2023-03-04: qty 2

## 2023-03-04 MED ORDER — KETOROLAC TROMETHAMINE 30 MG/ML IJ SOLN
30.0000 mg | Freq: Once | INTRAMUSCULAR | Status: AC
  Administered 2023-03-04: 30 mg via INTRAVENOUS
  Filled 2023-03-04: qty 1

## 2023-03-04 MED ORDER — DIPHENHYDRAMINE HCL 25 MG PO CAPS
25.0000 mg | ORAL_CAPSULE | Freq: Once | ORAL | Status: AC
  Administered 2023-03-04: 25 mg via ORAL
  Filled 2023-03-04: qty 1

## 2023-03-04 NOTE — ED Provider Notes (Signed)
[facility] Provider Note   CSN: [id number] Arrival date & time: 03/04/23  [id number]     History  Chief Complaint  Patient presents with   Chest Pain    Charles Douglas is a 52 y.o. male.   Chest Pain Associated symptoms: headache, nausea and shortness of breath   Associated symptoms: no abdominal pain, no back pain, no dizziness, no fever, no numbness, no vomiting and no weakness        Charles Douglas is a 52 y.o. male past medical history of MDD and polysubstance use was seen in the emergency department yesterday for similar symptoms.  Returns this morning requesting evaluation of recurrent headache and chest pain.  States chest pain woke him from sleep in the early morning hours.  Pain has persisted since onset.  No radiating pain.  No diaphoresis.  He has some mild shortness of breath associated with this chest pain.  He also states that he has had intermittent headaches for several weeks.  Describes headache as throbbing in quality and radiating from temple to temple.  Denies any history of hypertension, but believes his blood pressure has been running high.  Denies any dizziness, vomiting, neck pain or stiffness.  No visual change.  States he was prescribed medication yesterday but has not gotten the medication filled.   Home Medications Prior to Admission medications   Medication Sig Start Date End Date Taking? Authorizing Provider  citalopram (CELEXA) 20 MG tablet Take 20 mg by mouth daily.    [provider]  famotidine (PEPCID) 20 MG tablet Take 1 tablet (20 mg total) by mouth daily. 03/03/23   Godfrey Pick, MD  hydrOXYzine (ATARAX) 25 MG tablet Take 25 mg by mouth daily. 03/02/23   [provider]      Allergies    Bee venom, Penicillin g, Amitriptyline, and Trazodone and nefazodone    Review of Systems   Review of Systems  Constitutional:  Negative for appetite change, chills and fever.  Eyes:  Negative  for visual disturbance.  Respiratory:  Positive for shortness of breath.   Cardiovascular:  Positive for chest pain.  Gastrointestinal:  Positive for nausea. Negative for abdominal pain and vomiting.  Genitourinary:  Negative for dysuria.  Musculoskeletal:  Negative for back pain, neck pain and neck stiffness.  Skin:  Negative for rash.  Neurological:  Positive for headaches. Negative for dizziness, syncope, facial asymmetry, speech difficulty, weakness and numbness.  Psychiatric/Behavioral:  Negative for confusion.     Physical Exam Updated Vital Signs BP (!) 153/90 (BP Location: Right Arm)   Pulse 80   Temp 98 F (36.7 C) (Oral)   Resp 16   Ht 5\' 7"  (1.702 m)   Wt 83.5 kg   SpO2 100%   BMI 28.82 kg/m  Physical Exam Vitals and nursing note reviewed.  Constitutional:      Appearance: Normal appearance. He is not ill-appearing or toxic-appearing.  Eyes:     Extraocular Movements: Extraocular movements intact.     Conjunctiva/sclera: Conjunctivae normal.     Pupils: Pupils are equal, round, and reactive to light.  Neck:     Meningeal: Kernig's sign absent.  Cardiovascular:     Rate and Rhythm: Normal rate and regular rhythm.     Pulses: Normal pulses.  Pulmonary:     Effort: Pulmonary effort is normal.  Chest:     Chest wall: No tenderness.  Abdominal:     Palpations: Abdomen is  soft.     Tenderness: There is no abdominal tenderness.  Musculoskeletal:     Cervical back: Normal range of motion. No rigidity. No spinous process tenderness or muscular tenderness.     Right lower leg: No edema.     Left lower leg: No edema.  Skin:    General: Skin is warm.     Capillary Refill: Capillary refill takes less than 2 seconds.     Findings: No rash.  Neurological:     General: No focal deficit present.     Mental Status: He is alert.     Sensory: No sensory deficit.     Motor: No weakness.     ED Results / Procedures / Treatments   Labs (all labs ordered are listed,  but only abnormal results are displayed) Labs Reviewed  TROPONIN I (HIGH SENSITIVITY)    EKG EKG Interpretation  Date/Time:  Wednesday March 04 2023 08:31:37 EDT Ventricular Rate:  79 PR Interval:  160 QRS Duration: 89 QT Interval:  395 QTC Calculation: 453 R Axis:   34 Text Interpretation: Sinus rhythm Confirmed by Kommor, Madison ([id number]) on 03/04/2023 11:30:46 AM  Radiology CT Head Wo Contrast  Result Date: 03/03/2023 CLINICAL DATA:  New onset headache. EXAM: CT HEAD WITHOUT CONTRAST TECHNIQUE: Contiguous axial images were obtained from the base of the skull through the vertex without intravenous contrast. RADIATION DOSE REDUCTION: This exam was performed according to the departmental dose-optimization program which includes automated exposure control, adjustment of the mA and/or kV according to patient size and/or use of iterative reconstruction technique. COMPARISON:  06/01/2007 FINDINGS: Brain: Bilateral inferior frontal and anterior temporal encephalomalacia. Small right frontal cortex encephalomalacia. No hemorrhage, hydrocephalus, collection, or masslike finding. Vascular: No hyperdense vessel or unexpected calcification. Skull: Normal. Negative for fracture or focal lesion. Sinuses/Orbits: No acute finding. IMPRESSION: 1. No acute finding. 2. Bilateral frontotemporal encephalomalacia with posttraumatic pattern. Electronically Signed   By: Jorje Guild M.D.   On: 03/03/2023 07:34   DG Chest 2 View  Result Date: 03/03/2023 CLINICAL DATA:  Chest pain EXAM: CHEST - 2 VIEW COMPARISON:  12/13/2022 FINDINGS: The lungs are clear without focal pneumonia, edema, pneumothorax or pleural effusion. The cardiopericardial silhouette is within normal limits for size. The visualized bony structures of the thorax are unremarkable. Telemetry leads overlie the chest. IMPRESSION: No active cardiopulmonary disease. Electronically Signed   By: Misty Stanley M.D.   On: 03/03/2023 05:43     Procedures Procedures    Medications Ordered in ED Medications  ketorolac (TORADOL) 30 MG/ML injection 30 mg (has no administration in time range)  diphenhydrAMINE (BENADRYL) capsule 25 mg (has no administration in time range)  metoCLOPramide (REGLAN) injection 10 mg (has no administration in time range)    ED Course/ Medical Decision Making/ A&P                             Medical Decision Making Patient returns here today, seen yesterday for same.  Complains of recurrent chest pain this morning waking him from sleep.  Describes pain as pressure-like in his central chest and nonradiating.  Diffuse headache from temple to temple as well.  Symptoms have been intermittent for weeks.  Workup yesterday included delta troponin which was flat, CT head yesterday without acute findings chest x-ray yesterday was clear.  He was given prescription for famotidine, has not gotten it filled yet.  Denies any new symptoms.  My clinical suspicion for acute  ACS is low.  Vitals are reassuring no pleuritic pain hypoxia tachycardia or tachypnea to suggest PE.  Headache described as gradual and intermittent low clinical suspicion for subarachnoid hemorrhage.  No nuchal rigidity to suggest meningitis.  He had full workup yesterday that was reassuring.  I will repeat troponin today and EKG.  If reassuring, I suspect he will be discharged home. will try migraine cocktail for headache.  Amount and/or Complexity of Data Reviewed Labs: ordered.    Details: Troponin today 15, improved from yesterday ECG/medicine tests: ordered.    Details: EKG today shows sinus rhythm Discussion of management or test interpretation with external provider(s): On recheck, patient resting comfortably.  Sleeping but easily aroused.  Vital signs reassuring.  Discussed need for establishing PCP.  Doubt emergent process. Patient agreeable to plan,  Risk Prescription drug management.           Final Clinical Impression(s) / ED  Diagnoses Final diagnoses:  Nonspecific chest pain  Acute nonintractable headache, unspecified headache type    Rx / DC Orders ED Discharge Orders     None         Kem Parkinson, PA-C 03/04/23 [place], MD 03/04/23 [id number]

## 2023-03-04 NOTE — ED Triage Notes (Signed)
Pt states he was seen the other day & he is still having chest pain & headaches really bad.  Reports nausea & SOB but denies V/D.

## 2023-03-04 NOTE — Discharge Instructions (Signed)
Take your medication as directed.  Please follow-up with your primary care provider.  I have listed 2 of the local primary care clinics for you to contact to establish primary care.

## 2023-03-10 ENCOUNTER — Emergency Department (HOSPITAL_COMMUNITY)
Admission: EM | Admit: 2023-03-10 | Discharge: 2023-03-10 | Disposition: A | Discharge status: Home / Self Care | Payer: Medicaid Other | Attending: Emergency Medicine | Admitting: Emergency Medicine

## 2023-03-10 ENCOUNTER — Emergency Department (HOSPITAL_COMMUNITY): Payer: Medicaid Other

## 2023-03-10 ENCOUNTER — Other Ambulatory Visit: Payer: Self-pay

## 2023-03-10 DIAGNOSIS — R519 Headache, unspecified: Secondary | ICD-10-CM | POA: Insufficient documentation

## 2023-03-10 DIAGNOSIS — R079 Chest pain, unspecified: Secondary | ICD-10-CM | POA: Diagnosis present

## 2023-03-10 DIAGNOSIS — R0789 Other chest pain: Secondary | ICD-10-CM | POA: Diagnosis not present

## 2023-03-10 LAB — CBC
HCT: 42.1 % (ref 39.0–52.0)
Hemoglobin: 14.3 g/dL (ref 13.0–17.0)
MCH: 30 pg (ref 26.0–34.0)
MCHC: 34 g/dL (ref 30.0–36.0)
MCV: 88.3 fL (ref 80.0–100.0)
Platelets: 287 10*3/uL (ref 150–400)
RBC: 4.77 MIL/uL (ref 4.22–5.81)
RDW: 11.9 % (ref 11.5–15.5)
WBC: 10.5 10*3/uL (ref 4.0–10.5)
nRBC: 0 % (ref 0.0–0.2)

## 2023-03-10 LAB — BASIC METABOLIC PANEL
Anion gap: 9 (ref 5–15)
BUN: 24 mg/dL — ABNORMAL HIGH (ref 6–20)
CO2: 26 mmol/L (ref 22–32)
Calcium: 9.4 mg/dL (ref 8.9–10.3)
Chloride: 108 mmol/L (ref 98–111)
Creatinine, Ser: 1.37 mg/dL — ABNORMAL HIGH (ref 0.61–1.24)
GFR, Estimated: >60 mL/min (ref >60)
Glucose, Bld: 112 mg/dL — ABNORMAL HIGH (ref 70–99)
Potassium: 3.7 mmol/L (ref 3.5–5.1)
Sodium: 143 mmol/L (ref 135–145)

## 2023-03-10 LAB — TROPONIN I (HIGH SENSITIVITY): Troponin I (High Sensitivity): 14 ng/L (ref <18)

## 2023-03-10 MED ORDER — KETOROLAC TROMETHAMINE 30 MG/ML IJ SOLN
30.0000 mg | Freq: Once | INTRAMUSCULAR | Status: AC
  Administered 2023-03-10: 30 mg via INTRAVENOUS
  Filled 2023-03-10: qty 1

## 2023-03-10 NOTE — Discharge Instructions (Signed)
Follow-up with cardiology.  The contact information for the cardiology clinic here in [place] has been provided in this discharge summary for you to call and make these arrangements.

## 2023-03-10 NOTE — ED Notes (Signed)
Patient transported to X-ray 

## 2023-03-10 NOTE — ED Provider Notes (Signed)
[facility] Provider Note   CSN: [id number] Arrival date & time: 03/10/23  [id number]     History  Chief Complaint  Patient presents with   Chest Pain    Charles Douglas is a 52 y.o. male.  Patient is a 52 year old male with past medical history of substance-induced mood disorder.  Patient presenting today with complaints of headache and chest pain.  This has been ongoing for the past 5 days.  Patient seen here twice last week with similar symptoms, but no definitive cause was found.  He underwent cardiac workup and head CT, all of which were unremarkable.  He returns today with ongoing symptoms.  He describes a squeezing to the center of his chest with no shortness of breath, nausea, or radiation to the arm or jaw.  He denies any recent exertional symptoms.  He is concerned because his father had bypass surgery in his 52s.  The history is provided by the patient.       Home Medications Prior to Admission medications   Medication Sig Start Date End Date Taking? Authorizing Provider  citalopram (CELEXA) 20 MG tablet Take 20 mg by mouth daily.    [provider]  famotidine (PEPCID) 20 MG tablet Take 1 tablet (20 mg total) by mouth daily. 03/03/23   Godfrey Pick, MD  hydrOXYzine (ATARAX) 25 MG tablet Take 25 mg by mouth daily. 03/02/23   [provider]      Allergies    Bee venom, Penicillin g, Amitriptyline, and Trazodone and nefazodone    Review of Systems   Review of Systems  All other systems reviewed and are negative.   Physical Exam Updated Vital Signs BP (!) 149/92 (BP Location: Right Arm)   Pulse 88   Temp 99.4 F (37.4 C) (Oral)   Resp 18   Ht 5\' 7"  (1.702 m)   Wt 83.5 kg   SpO2 97%   BMI 28.83 kg/m  Physical Exam Vitals and nursing note reviewed.  Constitutional:      General: He is not in acute distress.    Appearance: He is well-developed. He is not diaphoretic.  HENT:     Head: Normocephalic  and atraumatic.  Eyes:     Extraocular Movements: Extraocular movements intact.     Pupils: Pupils are equal, round, and reactive to light.  Cardiovascular:     Rate and Rhythm: Normal rate and regular rhythm.     Heart sounds: No murmur heard.    No friction rub.  Pulmonary:     Effort: Pulmonary effort is normal. No respiratory distress.     Breath sounds: Normal breath sounds. No wheezing or rales.  Abdominal:     General: Bowel sounds are normal. There is no distension.     Palpations: Abdomen is soft.     Tenderness: There is no abdominal tenderness.  Musculoskeletal:        General: Normal range of motion.     Cervical back: Normal range of motion and neck supple.  Skin:    General: Skin is warm and dry.  Neurological:     General: No focal deficit present.     Mental Status: He is alert and oriented to person, place, and time.     Cranial Nerves: No cranial nerve deficit.     Motor: No weakness.     Coordination: Coordination normal.     ED Results / Procedures / Treatments   Labs (all labs  ordered are listed, but only abnormal results are displayed) Labs Reviewed  CBC  BASIC METABOLIC PANEL  TROPONIN I (HIGH SENSITIVITY)    EKG EKG Interpretation  Date/Time:  Tuesday March 10 2023 03:27:00 EDT Ventricular Rate:  89 PR Interval:  162 QRS Duration: 95 QT Interval:  378 QTC Calculation: 460 R Axis:   51 Text Interpretation: Sinus rhythm Normal ECG No significant change since 03/04/2023 Confirmed by Veryl Speak [phone]) on 03/10/2023 3:40:16 AM  Radiology No results found.  Procedures Procedures    Medications Ordered in ED Medications  ketorolac (TORADOL) 30 MG/ML injection 30 mg (has no administration in time range)    ED Course/ Medical Decision Making/ A&P  Patient is a 52 year old male presenting with complaints of chest pain and headache.  This been ongoing for nearly a week.  This is his third visit with similar complaints.  Patient arrives  here with stable vital signs.  He is afebrile and is neurologically intact.  Workup initiated including CBC, metabolic panel, and troponin, all of which are unremarkable.  Chest x-ray is clear.  Patient given IV Toradol and seems to be feeling better.  At this point, this has been his third cardiac workup in the last 5 days.  All have been negative.  I highly doubt a cardiac etiology and do not feel as though any further workup is indicated.  I also do not feel as though admission is indicated.  I will refer to cardiology as an outpatient.  Final Clinical Impression(s) / ED Diagnoses Final diagnoses:  None    Rx / DC Orders ED Discharge Orders     None         Veryl Speak, MD 03/10/23 [phone]

## 2023-03-10 NOTE — ED Triage Notes (Addendum)
Pt states he has been having centralized chest pain and headaches for a few weeks that hasn't improved. Pt is diaphoretic, denies SOB. Has been seen multiple times for the same in the last month. Reports he is under a lot of stress at home and work.

## 2023-03-18 NOTE — ED Notes (Signed)
Pt called to see if I could make an appt with cardiology for him. I gave him the number he needs to call to make his own appt.

## 2023-03-23 ENCOUNTER — Emergency Department (HOSPITAL_COMMUNITY)
Admission: EM | Admit: 2023-03-23 | Discharge: 2023-03-24 | Disposition: A | Discharge status: Home / Self Care | Payer: 59 | Attending: Emergency Medicine | Admitting: Emergency Medicine

## 2023-03-23 ENCOUNTER — Other Ambulatory Visit: Payer: Self-pay

## 2023-03-23 ENCOUNTER — Encounter (HOSPITAL_COMMUNITY): Payer: Self-pay

## 2023-03-23 DIAGNOSIS — R519 Headache, unspecified: Secondary | ICD-10-CM | POA: Insufficient documentation

## 2023-03-23 DIAGNOSIS — R0789 Other chest pain: Secondary | ICD-10-CM | POA: Insufficient documentation

## 2023-03-23 DIAGNOSIS — R079 Chest pain, unspecified: Secondary | ICD-10-CM

## 2023-03-23 NOTE — ED Triage Notes (Signed)
Pt states that he has a headache and chest pain. Been here multiple[le times for same thing. States he is supposed to follow up with cardiology on the 3rd of may.

## 2023-03-24 LAB — BASIC METABOLIC PANEL
Anion gap: 10 (ref 5–15)
BUN: 22 mg/dL — ABNORMAL HIGH (ref 6–20)
CO2: 26 mmol/L (ref 22–32)
Calcium: 8.8 mg/dL — ABNORMAL LOW (ref 8.9–10.3)
Chloride: 106 mmol/L (ref 98–111)
Creatinine, Ser: 1.27 mg/dL — ABNORMAL HIGH (ref 0.61–1.24)
GFR, Estimated: >60 mL/min (ref >60)
Glucose, Bld: 136 mg/dL — ABNORMAL HIGH (ref 70–99)
Potassium: 3.6 mmol/L (ref 3.5–5.1)
Sodium: 142 mmol/L (ref 135–145)

## 2023-03-24 LAB — CBC WITH DIFFERENTIAL/PLATELET
Abs Immature Granulocytes: 0.02 10*3/uL (ref 0.00–0.07)
Basophils Absolute: 0 10*3/uL (ref 0.0–0.1)
Basophils Relative: 0 %
Eosinophils Absolute: 0 10*3/uL (ref 0.0–0.5)
Eosinophils Relative: 0 %
HCT: 39.3 % (ref 39.0–52.0)
Hemoglobin: 13.1 g/dL (ref 13.0–17.0)
Immature Granulocytes: 0 %
Lymphocytes Relative: 24 %
Lymphs Abs: 2.2 10*3/uL (ref 0.7–4.0)
MCH: 29.8 pg (ref 26.0–34.0)
MCHC: 33.3 g/dL (ref 30.0–36.0)
MCV: 89.5 fL (ref 80.0–100.0)
Monocytes Absolute: 0.6 10*3/uL (ref 0.1–1.0)
Monocytes Relative: 7 %
Neutro Abs: 6.4 10*3/uL (ref 1.7–7.7)
Neutrophils Relative %: 69 %
Platelets: 214 10*3/uL (ref 150–400)
RBC: 4.39 MIL/uL (ref 4.22–5.81)
RDW: 12.3 % (ref 11.5–15.5)
WBC: 9.4 10*3/uL (ref 4.0–10.5)
nRBC: 0 % (ref 0.0–0.2)

## 2023-03-24 LAB — TROPONIN I (HIGH SENSITIVITY): Troponin I (High Sensitivity): 6 ng/L (ref <18)

## 2023-03-24 MED ORDER — KETOROLAC TROMETHAMINE 60 MG/2ML IM SOLN
60.0000 mg | Freq: Once | INTRAMUSCULAR | Status: AC
  Administered 2023-03-24: 60 mg via INTRAMUSCULAR
  Filled 2023-03-24: qty 2

## 2023-03-24 NOTE — ED Provider Notes (Signed)
[place] EMERGENCY DEPARTMENT AT [facility] Provider Note   CSN: [id number] Arrival date & time: 03/23/23  2322     History  Chief Complaint  Patient presents with   Chest Pain   Headache    Charles Douglas is a 52 y.o. male.  Patient is a 52 year old male with history of substance induced mood disorder, GERD.  Patient presenting today with complaints of chest discomfort and headache.  This has been ongoing for the past several weeks.  This is his fourth visit to the ER with similar complaints.  He has undergone head CT and multiple cardiac workups, but all been unremarkable.  He tells me he has an appointment upcoming with cardiology on May 2.  He describes ongoing discomfort in the center of his chest that is worse when he moves or breathes.  He also describes generalized headache with no visual disturbances, weakness, numbness.  He denies any fevers, chills, or cough.  He denies stiff neck.  The history is provided by the patient.  Headache      Home Medications Prior to Admission medications   Medication Sig Start Date End Date Taking? Authorizing Provider  citalopram (CELEXA) 20 MG tablet Take 20 mg by mouth daily.    [provider]  famotidine (PEPCID) 20 MG tablet Take 1 tablet (20 mg total) by mouth daily. 03/03/23   Gloris Manchester, MD  hydrOXYzine (ATARAX) 25 MG tablet Take 25 mg by mouth daily. 03/02/23   [provider]      Allergies    Bee venom, Penicillin g, Amitriptyline, and Trazodone and nefazodone    Review of Systems   Review of Systems  All other systems reviewed and are negative.   Physical Exam Updated Vital Signs BP (!) 166/90   Pulse 83   Temp 98.5 F (36.9 C)   Resp 18   Wt 83.5 kg   SpO2 93%   BMI 28.82 kg/m  Physical Exam Vitals and nursing note reviewed.  Constitutional:      General: He is not in acute distress.    Appearance: He is well-developed. He is not diaphoretic.  HENT:     Head:  Normocephalic and atraumatic.  Eyes:     Extraocular Movements: Extraocular movements intact.     Pupils: Pupils are equal, round, and reactive to light.  Cardiovascular:     Rate and Rhythm: Normal rate and regular rhythm.     Heart sounds: No murmur heard.    No friction rub.  Pulmonary:     Effort: Pulmonary effort is normal. No respiratory distress.     Breath sounds: Normal breath sounds. No wheezing or rales.     Comments: There is tenderness to palpation of the anterior chest wall.  This reproduces his symptoms. Abdominal:     General: Bowel sounds are normal. There is no distension.     Palpations: Abdomen is soft.     Tenderness: There is no abdominal tenderness.  Musculoskeletal:        General: Normal range of motion.     Cervical back: Normal range of motion and neck supple.     Right lower leg: No tenderness. No edema.     Left lower leg: No tenderness. No edema.  Skin:    General: Skin is warm and dry.  Neurological:     General: No focal deficit present.     Mental Status: He is alert and oriented to person, place, and time.  Cranial Nerves: No cranial nerve deficit.     Motor: No weakness.     Coordination: Coordination normal.     ED Results / Procedures / Treatments   Labs (all labs ordered are listed, but only abnormal results are displayed) Labs Reviewed - No data to display  EKG EKG Interpretation  Date/Time:  Monday March 23 2023 23:37:52 EDT Ventricular Rate:  79 PR Interval:  166 QRS Duration: 96 QT Interval:  392 QTC Calculation: 450 R Axis:   51 Text Interpretation: Sinus rhythm Normal ECG Unchanged from 03/10/2023 Confirmed by Geoffery Lyons ([id number]) on 03/23/2023 11:50:45 PM  Radiology No results found.  Procedures Procedures  {Document cardiac monitor, telemetry assessment procedure when appropriate:1}  Medications Ordered in ED Medications  ketorolac (TORADOL) injection 60 mg (has no administration in time range)    ED Course/  Medical Decision Making/ A&P   {   Click here for ABCD2, HEART and other calculatorsREFRESH Note before signing :1}                          Medical Decision Making Amount and/or Complexity of Data Reviewed Labs: ordered.  Risk Prescription drug management.   ***  {Document critical care time when appropriate:1} {Document review of labs and clinical decision tools ie heart score, Chads2Vasc2 etc:1}  {Document your independent review of radiology images, and any outside records:1} {Document your discussion with family members, caretakers, and with consultants:1} {Document social determinants of health affecting pt's care:1} {Document your decision making why or why not admission, treatments were needed:1} Final Clinical Impression(s) / ED Diagnoses Final diagnoses:  None    Rx / DC Orders ED Discharge Orders     None

## 2023-03-24 NOTE — Discharge Instructions (Signed)
Take ibuprofen 600 mg every 6 hours as needed for pain.  Follow-up with cardiology as scheduled.  If symptoms persist, call the office and see if they can move this appointment up.

## 2023-04-07 ENCOUNTER — Ambulatory Visit: Payer: Medicaid Other | Attending: Internal Medicine | Admitting: Internal Medicine

## 2023-04-07 NOTE — Progress Notes (Signed)
Erroneous encounter - please disregard.

## 2023-04-09 ENCOUNTER — Encounter: Payer: Self-pay | Admitting: Internal Medicine
